# Patient Record
Sex: Female | Born: 1955 | Race: White | Marital: Single | Smoking: Never smoker
Health system: Southern US, Community
[De-identification: ages and names within clinical notes are randomized; demographics above are authoritative.]

## PROBLEM LIST (undated history)

## (undated) DIAGNOSIS — F1290 Cannabis use, unspecified, uncomplicated: Secondary | ICD-10-CM

## (undated) DIAGNOSIS — D649 Anemia, unspecified: Secondary | ICD-10-CM

## (undated) DIAGNOSIS — A549 Gonococcal infection, unspecified: Secondary | ICD-10-CM

## (undated) DIAGNOSIS — K08409 Partial loss of teeth, unspecified cause, unspecified class: Secondary | ICD-10-CM

## (undated) DIAGNOSIS — Z9289 Personal history of other medical treatment: Secondary | ICD-10-CM

## (undated) DIAGNOSIS — R87619 Unspecified abnormal cytological findings in specimens from cervix uteri: Secondary | ICD-10-CM

## (undated) DIAGNOSIS — J45909 Unspecified asthma, uncomplicated: Secondary | ICD-10-CM

## (undated) DIAGNOSIS — Z87898 Personal history of other specified conditions: Secondary | ICD-10-CM

## (undated) DIAGNOSIS — D219 Benign neoplasm of connective and other soft tissue, unspecified: Secondary | ICD-10-CM

## (undated) DIAGNOSIS — Z8679 Personal history of other diseases of the circulatory system: Secondary | ICD-10-CM

## (undated) DIAGNOSIS — R51 Headache: Secondary | ICD-10-CM

## (undated) DIAGNOSIS — N750 Cyst of Bartholin's gland: Secondary | ICD-10-CM

## (undated) DIAGNOSIS — Z8619 Personal history of other infectious and parasitic diseases: Secondary | ICD-10-CM

## (undated) DIAGNOSIS — G43909 Migraine, unspecified, not intractable, without status migrainosus: Secondary | ICD-10-CM

## (undated) DIAGNOSIS — O99345 Other mental disorders complicating the puerperium: Secondary | ICD-10-CM

## (undated) DIAGNOSIS — IMO0002 Reserved for concepts with insufficient information to code with codable children: Secondary | ICD-10-CM

## (undated) DIAGNOSIS — Z8669 Personal history of other diseases of the nervous system and sense organs: Secondary | ICD-10-CM

## (undated) DIAGNOSIS — B1920 Unspecified viral hepatitis C without hepatic coma: Secondary | ICD-10-CM

## (undated) DIAGNOSIS — B009 Herpesviral infection, unspecified: Secondary | ICD-10-CM

## (undated) DIAGNOSIS — F53 Postpartum depression: Secondary | ICD-10-CM

## (undated) HISTORY — DX: Unspecified viral hepatitis C without hepatic coma: B19.20

## (undated) HISTORY — DX: Cyst of Bartholin's gland: N75.0

## (undated) HISTORY — PX: CLAVICLE EXCISION: SUR503

## (undated) HISTORY — DX: Benign neoplasm of connective and other soft tissue, unspecified: D21.9

## (undated) HISTORY — DX: Postpartum depression: F53.0

## (undated) HISTORY — DX: Unspecified asthma, uncomplicated: J45.909

## (undated) HISTORY — DX: Personal history of other diseases of the circulatory system: Z86.79

## (undated) HISTORY — DX: Migraine, unspecified, not intractable, without status migrainosus: G43.909

## (undated) HISTORY — DX: Personal history of other specified conditions: Z87.898

## (undated) HISTORY — DX: Headache: R51

## (undated) HISTORY — DX: Other mental disorders complicating the puerperium: O99.345

## (undated) HISTORY — DX: Personal history of other infectious and parasitic diseases: Z86.19

## (undated) HISTORY — DX: Herpesviral infection, unspecified: B00.9

## (undated) HISTORY — DX: Cannabis use, unspecified, uncomplicated: F12.90

## (undated) HISTORY — DX: Personal history of other medical treatment: Z92.89

## (undated) HISTORY — DX: Unspecified abnormal cytological findings in specimens from cervix uteri: R87.619

## (undated) HISTORY — DX: Partial loss of teeth, unspecified cause, unspecified class: K08.409

## (undated) HISTORY — DX: Reserved for concepts with insufficient information to code with codable children: IMO0002

## (undated) HISTORY — PX: WISDOM TOOTH EXTRACTION: SHX21

## (undated) HISTORY — DX: Anemia, unspecified: D64.9

## (undated) HISTORY — DX: Gonococcal infection, unspecified: A54.9

## (undated) HISTORY — DX: Personal history of other diseases of the nervous system and sense organs: Z86.69

---

## 1974-07-05 HISTORY — PX: CYSTECTOMY: SUR359

## 2008-07-05 HISTORY — PX: DILATION AND CURETTAGE OF UTERUS: SHX78

## 2011-12-30 ENCOUNTER — Other Ambulatory Visit: Payer: Self-pay | Admitting: Internal Medicine

## 2011-12-30 ENCOUNTER — Ambulatory Visit
Admission: RE | Admit: 2011-12-30 | Discharge: 2011-12-30 | Disposition: A | Discharge status: Home / Self Care | Payer: Self-pay | Source: Ambulatory Visit | Attending: Internal Medicine | Admitting: Internal Medicine

## 2011-12-30 DIAGNOSIS — R1031 Right lower quadrant pain: Secondary | ICD-10-CM

## 2011-12-30 MED ORDER — IOHEXOL 300 MG/ML  SOLN
100.0000 mL | Freq: Once | INTRAMUSCULAR | Status: AC | PRN
  Administered 2011-12-30: 100 mL via INTRAVENOUS

## 2012-02-01 ENCOUNTER — Encounter: Payer: Self-pay | Admitting: Obstetrics and Gynecology

## 2012-02-16 ENCOUNTER — Ambulatory Visit (INDEPENDENT_AMBULATORY_CARE_PROVIDER_SITE_OTHER): Payer: BC Managed Care – PPO | Admitting: Obstetrics and Gynecology

## 2012-02-16 ENCOUNTER — Encounter: Payer: Self-pay | Admitting: Obstetrics and Gynecology

## 2012-02-16 VITALS — BP 100/62 | HR 68 | Ht 67.0 in | Wt 133.0 lb

## 2012-02-16 DIAGNOSIS — N951 Menopausal and female climacteric states: Secondary | ICD-10-CM

## 2012-02-16 DIAGNOSIS — G4700 Insomnia, unspecified: Secondary | ICD-10-CM

## 2012-02-16 NOTE — Patient Instructions (Addendum)
Begin Progesterone cream 100 mg once daily every other day when Bi-est is applied

## 2012-02-16 NOTE — Progress Notes (Signed)
Menopausal symptoms:anxiety, dry skin, hot flashes, insomnia, moodiness, no energy, vaginal dryness  The patient is taking hormone replacement therapy The patient  is taking a Calcium supplement. The patient participates in regular exercise: yes. Post-menopausal bleeding:no  The patient is sexually active.  Last Pap: was normal 2 YEARS AGO Last mammogram: was normal April  2013 Last DEXA scan : 3 YEARS AGO NL  History of DVT/PE: NO Family history of breast cancer: No Family history of endometrial cancer:No

## 2012-02-16 NOTE — Progress Notes (Signed)
56 YO on BHRT: Bi-est (80:20) 2.5 Cream- 1 pump every other day, Progesterone 100 mg Cream bid, DHEA 10 mg every other day and Testosterone 0.5 mg Cream, and  Estrovera daily.  She uses [facility] in [facility], [place], [place]  ([place], [place]. Has a history of Hepatitis C (since age 70) that has been stable and takes herbal supplements to support her liver. She presents today because of hot flashes, moodiness and insomnia.  Patient was previously on the same Biest dose, but daily and took oral Progesterone 200 mg daily.  Her (saliva) hormone results showed that her Pg/E2 ratio was low as was her DHEAS.  Her estradiol was 2.8 ,  testosterone 49 and morning Cortisol 7.6. States that her vitamin D done by PCP was normal and thyroid panel also (copy of labs are forthcoming)  A: Menopausal Symptoms     Bio-Identical Hormone Therapy     Hepatitis C  P:  Spent 60 minutes face-to-face time with patient discussing her menstrual history, hormone therapy history and past/present symptoms        Advised patient that she did not need to take both  Estrovera  and  hormone therapy as they are for the same symptoms-gave      brochure on Estrovera       Explained the difference in progesterone dosing orally vs transdermal route in that the latter allows for significantly lower doses      Patient to begin taking Progesterone 100 mg Cream only once every other day with her Biest until that prescription has been used      then an order will be placed for Progesterone Cream 20 mg      Once patient has depleted current Biest dose, that too will be changed to Biest (90:10) 2.5 mg      Given that patient gets excess androgen symptoms (acne, oily skin) with daily DHEAS and the fact that she is using Testosterone 0.5 mg/d     I recommended that she stop the DHEA and reduce the Testosterone dose.  For now she wants to continue Testosterone dose but will    stop the DHEA.  Reviewed risks of  excess testosterone,  to include, but not limited to: increased lipids, unwanted hair growth, female pattern baldness,     deepening of voice, clitoromegaly, acne, oily skin and aggression.      To do hormonal test once patient's been on new regimen at least for 8 weeks with repeat saliva test (estradiol and progesterone only)      Reviewed sleep hygiene       RTO-TBA  Branden Shallenberger, PA-C

## 2012-02-17 ENCOUNTER — Encounter: Payer: Self-pay | Admitting: Obstetrics and Gynecology

## 2012-02-18 ENCOUNTER — Telehealth: Payer: Self-pay | Admitting: Obstetrics and Gynecology

## 2012-02-18 NOTE — Telephone Encounter (Signed)
TRIAGE/APPT.AND RX QUES.

## 2012-02-21 ENCOUNTER — Telehealth: Payer: Self-pay

## 2012-02-21 NOTE — Telephone Encounter (Signed)
Lm on pt vm to call back

## 2012-02-25 ENCOUNTER — Telehealth: Payer: Self-pay

## 2012-03-01 ENCOUNTER — Encounter: Payer: Self-pay | Admitting: Obstetrics and Gynecology

## 2012-04-18 ENCOUNTER — Telehealth: Payer: Self-pay | Admitting: Obstetrics and Gynecology

## 2012-04-18 NOTE — Telephone Encounter (Signed)
FAx received from pharmacy regarding possible change in medication. TC to pt.  States at last visit with EP, it was discussed possible change in dosage of medication when current Rx was completed.  Stats has been using the Progesterone and Bi-est the same night, every other night.  On the nights she doesn't use the cream, is having night sweats.  Questioning if needs dosage changed and how often to use.  Also questioning when to have next bio-identical testing done.  EP to be made aware.

## 2012-04-18 NOTE — Telephone Encounter (Signed)
Fax received re change in med.  States at last visit EP was going to change

## 2012-04-21 ENCOUNTER — Telehealth: Payer: Self-pay | Admitting: Obstetrics and Gynecology

## 2012-04-21 NOTE — Telephone Encounter (Signed)
Returned pt's call. Informed of Rx . To retest in 8 weeks. Pt verbalizes comprehension.

## 2012-04-21 NOTE — Telephone Encounter (Signed)
TC to pt. LM RX has been called to pharmacy. To use daily.  To call for further instructions.  Per EP after pt has used new dosage for 8 weeks to be retested. To let us know if needs kit. Rx called to Sharee Pimple at [facility] [phone]:    Progeseterone 30 mg  Cream daily, use as directed, 1 month supply with 12 rf    Biest  (80:20) 2.5 mg daily, use as directed , 1 month supply with 12 rf

## 2012-07-04 ENCOUNTER — Other Ambulatory Visit: Payer: Self-pay | Admitting: Obstetrics and Gynecology

## 2012-07-04 NOTE — Telephone Encounter (Signed)
Tc from pt regarding message. Pt states that she and the pharmacist has been trying to contact the  Office for refills on pt hormone meds. Called in rx Testosterone topical gel 1MG /GM,

## 2012-07-04 NOTE — Telephone Encounter (Signed)
Continuing from last note. Called in 3 rx for pt to her pharmacy. Testosterone gel 1MG /GM, Progesterone 30 mg cream, Biest (80:20) 2.5 mg total to use as directed. Informed pt that we need to retest and pt states that she will pick up Saliva Test when she pick up med and as soon as she get results back; she wil make an appt to see ep.

## 2012-09-04 ENCOUNTER — Encounter: Payer: BC Managed Care – PPO | Admitting: Obstetrics and Gynecology

## 2012-09-13 ENCOUNTER — Ambulatory Visit: Payer: BC Managed Care – PPO | Admitting: Obstetrics and Gynecology

## 2012-09-13 ENCOUNTER — Encounter: Payer: Self-pay | Admitting: Obstetrics and Gynecology

## 2012-09-13 VITALS — BP 102/60 | Wt 138.0 lb

## 2012-09-13 DIAGNOSIS — Z7189 Other specified counseling: Secondary | ICD-10-CM

## 2012-09-13 DIAGNOSIS — N951 Menopausal and female climacteric states: Secondary | ICD-10-CM

## 2012-09-13 NOTE — Progress Notes (Signed)
56 YO  on Biest (80:20) 2.5 mg daily with Progesterone ? dosage (60 mg/gm & takes 1/2 mL), and  Testostrone 0.5 mg.  Patient stopped DHEA in August because of it causing acne.  She complains of hot flashes and states that if she didn't take Estrovera with her hormones they would be unbearable.  She goes on to say that it is an expensive product (i.e. $32/month).  Additionally she reports increased "fat" around her waist, moodiness and insomnia.  She's here today to review her hormone results.  Estradiol = 3.0 9 (range 1.3-3.3),  Progesterone = 240 (H) (range 12-100) , Pg/E2 = 80 (L) (range 100-500), Testosterone = 7 (L)  (range 16-55); DHEAS = < 1.0  (range 2-23); and cortisol = 7.4  (range 3.7-9.5).  Patient's labs indicate adrenal strain, estrogen dominance and depressed androgen function..  Patient's progesterone dosage is uncertain so will verify with pharmacy.  The patient would like to decrease her estrogen amount.  Patient reports amenorrhea at age 89 x 4 years then again age 64 for good.  Additionally has had two occasions when her right lower quadrant appears to swell , she gets constipated and attributes this to what she describes as times when her estrogen surges.  A: Menopausal Symptoms     Occasional Pelvic Discomfort  P: Verify medication doses with [facility]  [phone] [patient's progesterone is 30 mg/day      Plans are to alter estrogen dose (decrease),  consider 5 mg of DHEA instead of  10 mg  in an effort to     increase the testosterone & stabilize cortisol without causing the patient any side effects (i.e. acne)     Urged periodic renewal time to support adrenals (in addition to the supplements she has chosen to take)      To prescribe Biest (50:50)  1 mg/1/2 ml  #30 mL  apply daily as directed 11 refills     Progesterone 4%/1/2 ml   #30 mL  apply qhs daily 11 refills     Tesosterone 0.75 mg  #30 mL apply q a.m. daily between thighs 4 refills      DHEA 5mg   #30 1 po qd  11  refills        Follow up in 6 weeks        Will do ultrasound for pelvic discomfort prior to annual       RTO-AEx  or prn      Once new therapy has begun, will follow up in 3 months with symptom check or repeat saliva test      Jamare Vanatta, PA-C

## 2012-09-14 MED ORDER — AMBULATORY NON FORMULARY MEDICATION: Status: DC

## 2012-09-14 MED ORDER — AMBULATORY NON FORMULARY MEDICATION: 5.0000 mg | Freq: Every day | Status: AC

## 2012-09-14 MED ORDER — AMBULATORY NON FORMULARY MEDICATION: 40.0000 mg | Freq: Every day | Status: DC

## 2012-09-14 MED ORDER — AMBULATORY NON FORMULARY MEDICATION: 0.7500 mg | Freq: Every morning | Status: AC

## 2012-12-17 IMAGING — CT CT ABD-PELV W/ CM
1 of 5 series · 14 of 36 positions shown, 19 images · IV contrast (READICAT/WATER & [ID] [id number])
Comparison: None.

CLINICAL DATA: Right lower quadrant pain.

CT ABDOMEN AND PELVIS WITH CONTRAST
TECHNIQUE: Multidetector CT imaging of the abdomen and pelvis was
performed following the standard protocol during bolus
administration of intravenous contrast.
Contrast: 100mL OMNIPAQUE IOHEXOL 300 MG/ML  SOLN

[Series 2: abd/pelvis with · axial · 0.70mm/px · z∈[-358,+2]mm · 14 of 82 slices shown, 19 images]
[im 5/82  soft-tissue]
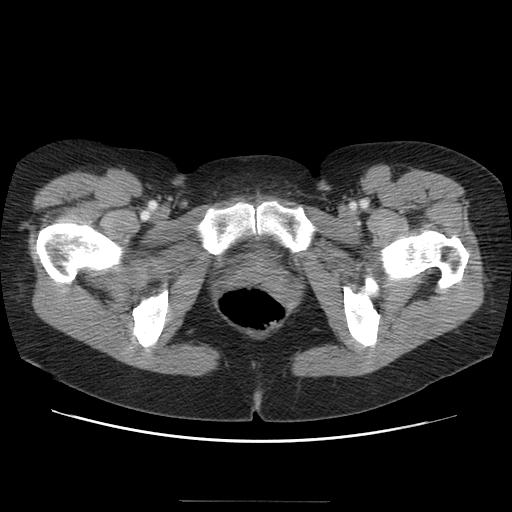
[im 5/82  bone]
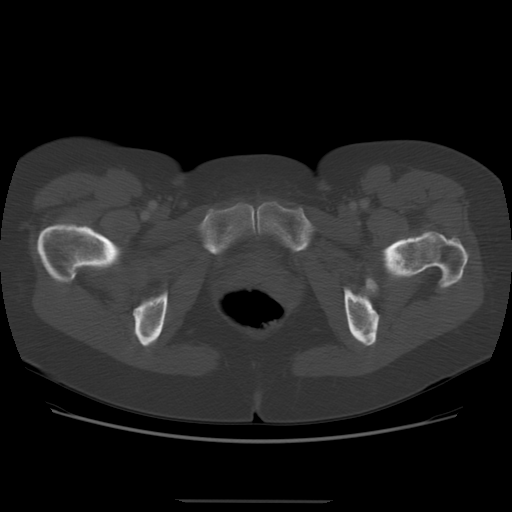
[im 10/82  soft-tissue]
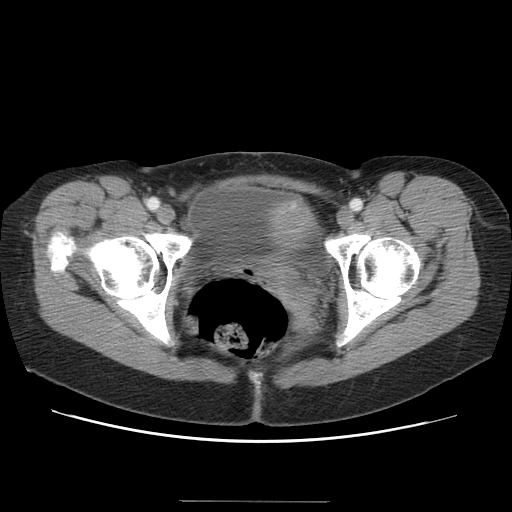
[im 19/82  soft-tissue]
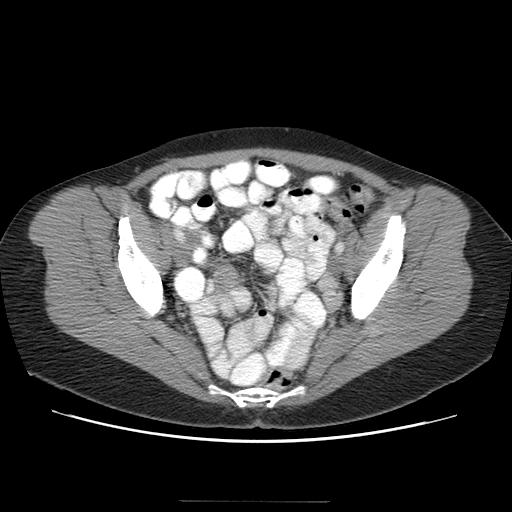
[im 23/82  soft-tissue]
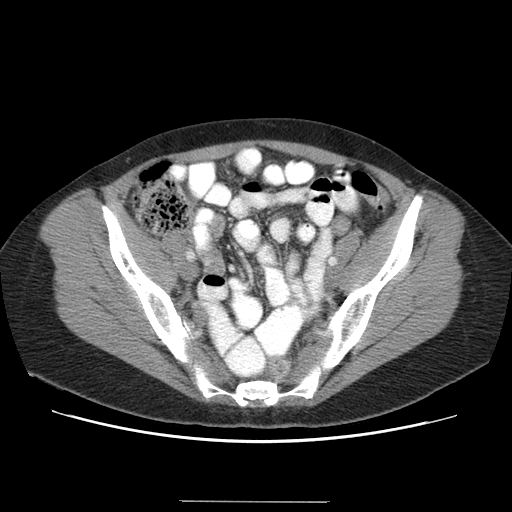
[im 28/82  soft-tissue]
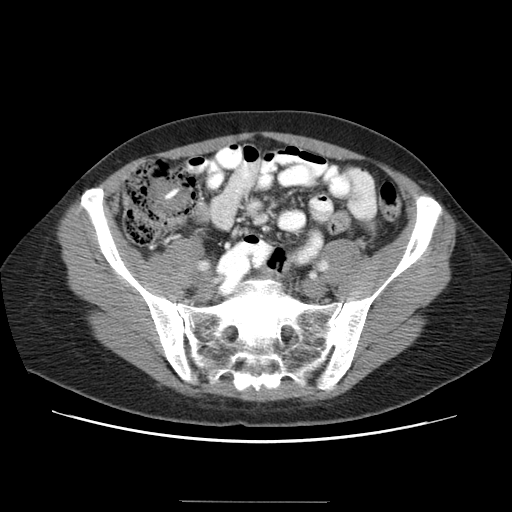
[im 37/82  soft-tissue]
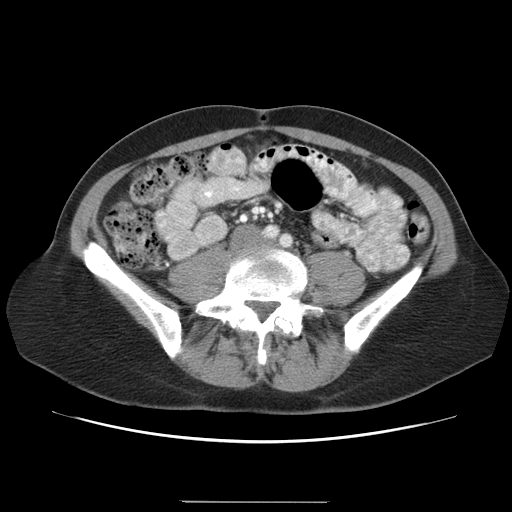
[im 41/82  soft-tissue]
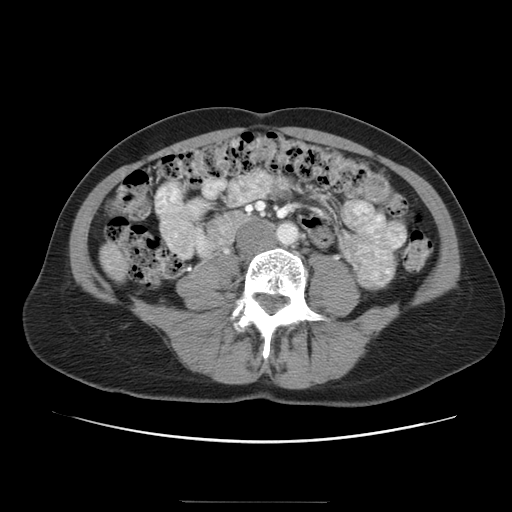
[im 46/82  soft-tissue]
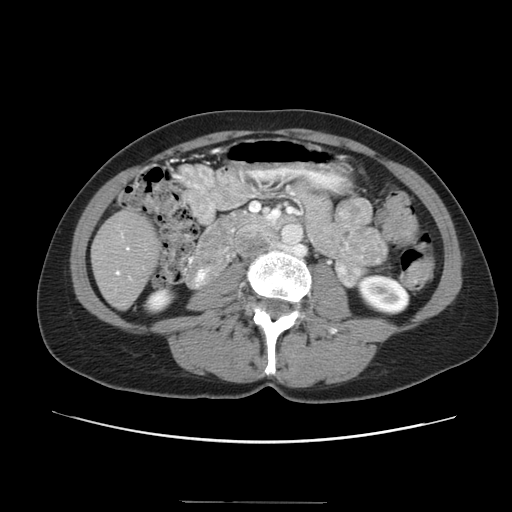
[im 55/82  soft-tissue]
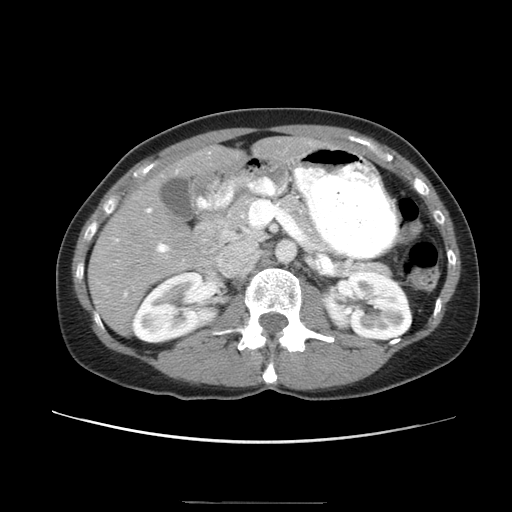
[im 55/82  bone]
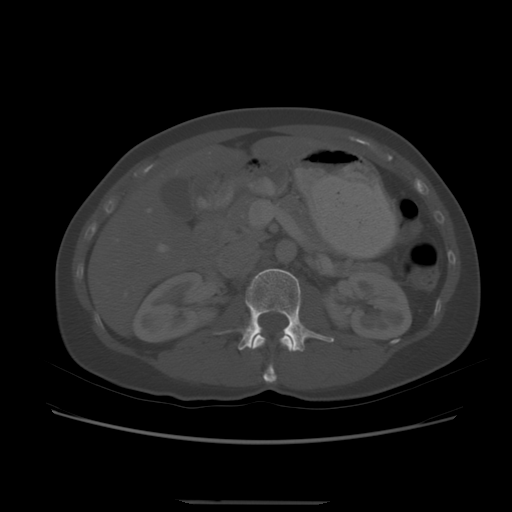
[im 59/82  soft-tissue]
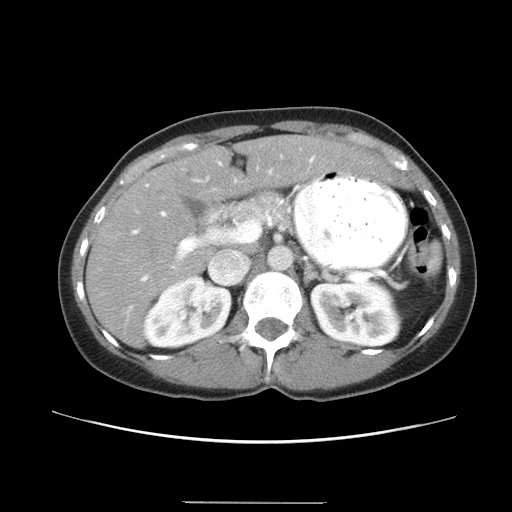
[im 64/82  soft-tissue]
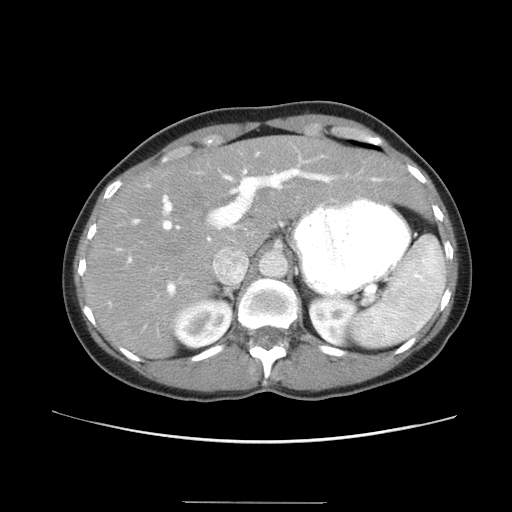
[im 64/82  lung]
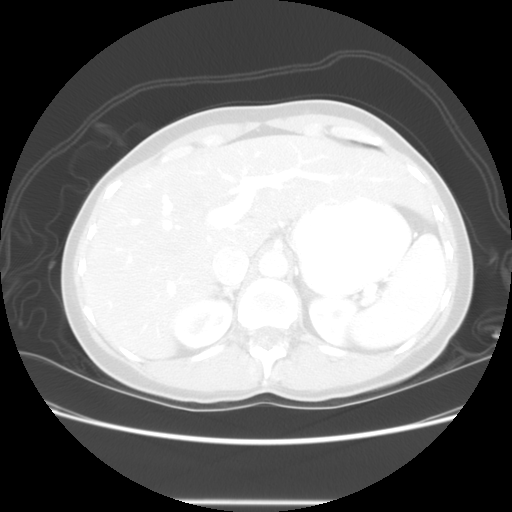
[im 68/82  lung]
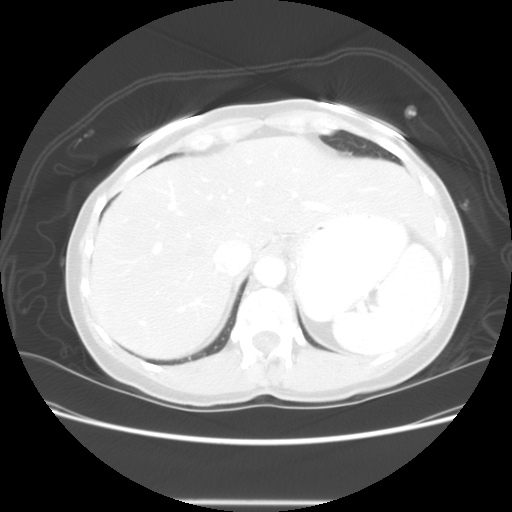
[im 73/82  soft-tissue]
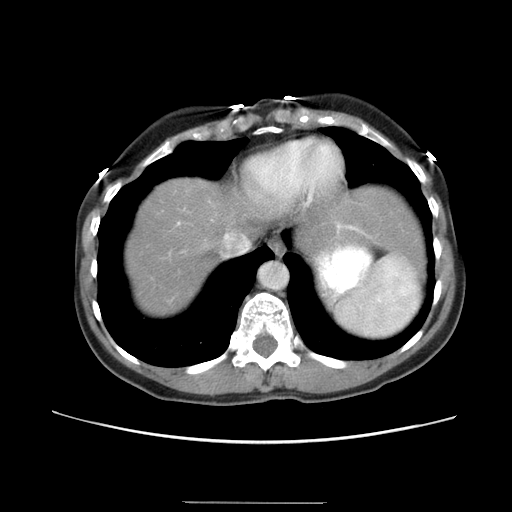
[im 73/82  lung]
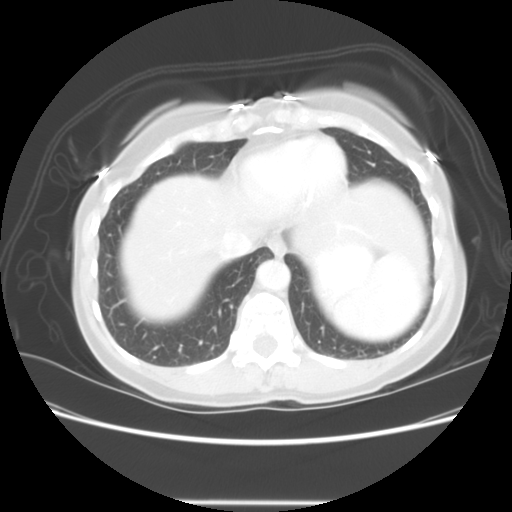
[im 77/82  soft-tissue]
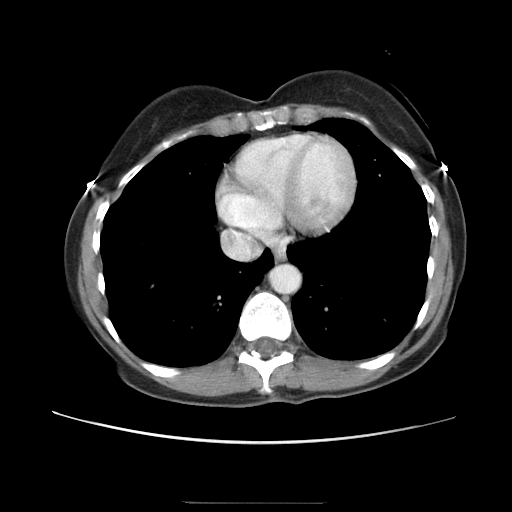
[im 77/82  lung]
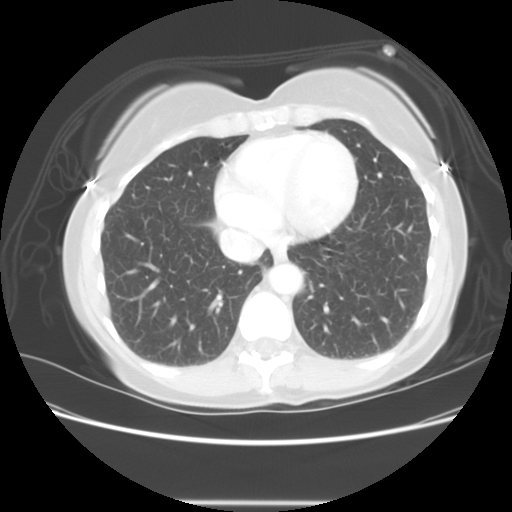

[14 of 36 positions shown; findings below may reference images not displayed]

FINDINGS: Lung bases are clear.  No pericardial fluid.

Low attenuation liver suggests fatty infiltration.  No focal
hepatic lesion present.  Gallbladder, pancreas, spleen, adrenal
glands, and kidneys are normal.  The stomach, small bowel, and
cecum are normal.  There is a linear oblong density at the
ileocecal valve which may represent an oral medication.  The
appendix is not identified; however, no pericecal inflammation is
present.  Moderatevolume stool throughout the colon.  No evidence
diverticulitis.

Abdominal aorta normal caliber.  There is a left retroaortic renal
vein.

No free fluid the pelvis.  There is a lobular contour to the uterus
with enhancing rounded lesions suggesting leiomyomas.  The bladder
appears normal.  There is no adnexal abnormality.  No pelvic
lymphadenopathy. Review of  bone windows demonstrates no aggressive
osseous lesions.  There are there is mild facet hypertrophy in the
lower lumbar spine.
IMPRESSION: 1..  No clear explanation for right lower quadrant pain.
2.  Appendix is not identified but there is no pericecal
inflammation or secondary signs appendicitis.
3.  Fibroid uterus.
4.  Potential mild hepatic steatosis.
5..  Mild facet arthropathy lower lumbar spine.

## 2014-05-06 ENCOUNTER — Encounter: Payer: Self-pay | Admitting: Obstetrics and Gynecology

## 2016-06-02 DIAGNOSIS — K7460 Unspecified cirrhosis of liver: Secondary | ICD-10-CM | POA: Insufficient documentation

## 2019-02-07 ENCOUNTER — Other Ambulatory Visit: Payer: Self-pay | Admitting: Gastroenterology

## 2019-02-07 DIAGNOSIS — Z8619 Personal history of other infectious and parasitic diseases: Secondary | ICD-10-CM

## 2019-02-16 ENCOUNTER — Other Ambulatory Visit: Payer: Self-pay

## 2019-02-22 ENCOUNTER — Other Ambulatory Visit: Payer: Self-pay | Admitting: Gastroenterology

## 2019-02-22 ENCOUNTER — Other Ambulatory Visit: Payer: Self-pay

## 2019-10-28 DIAGNOSIS — R921 Mammographic calcification found on diagnostic imaging of breast: Secondary | ICD-10-CM | POA: Insufficient documentation

## 2020-04-03 ENCOUNTER — Other Ambulatory Visit: Payer: Self-pay | Admitting: Gastroenterology

## 2020-04-03 DIAGNOSIS — Z8619 Personal history of other infectious and parasitic diseases: Secondary | ICD-10-CM

## 2020-10-15 ENCOUNTER — Other Ambulatory Visit: Payer: Self-pay | Admitting: Gastroenterology

## 2020-10-15 DIAGNOSIS — Z8719 Personal history of other diseases of the digestive system: Secondary | ICD-10-CM

## 2020-10-15 DIAGNOSIS — Z8619 Personal history of other infectious and parasitic diseases: Secondary | ICD-10-CM

## 2020-11-07 ENCOUNTER — Other Ambulatory Visit: Payer: Self-pay

## 2020-11-07 ENCOUNTER — Ambulatory Visit
Admission: RE | Admit: 2020-11-07 | Discharge: 2020-11-07 | Disposition: A | Discharge status: Home / Self Care | Payer: Medicare Other | Source: Ambulatory Visit | Attending: Gastroenterology | Admitting: Gastroenterology

## 2020-11-07 DIAGNOSIS — Z8719 Personal history of other diseases of the digestive system: Secondary | ICD-10-CM

## 2020-11-07 DIAGNOSIS — Z8619 Personal history of other infectious and parasitic diseases: Secondary | ICD-10-CM

## 2021-04-22 DIAGNOSIS — M79641 Pain in right hand: Secondary | ICD-10-CM | POA: Insufficient documentation

## 2021-04-22 DIAGNOSIS — M65331 Trigger finger, right middle finger: Secondary | ICD-10-CM | POA: Insufficient documentation

## 2021-10-09 ENCOUNTER — Other Ambulatory Visit: Payer: Self-pay | Admitting: Obstetrics and Gynecology

## 2021-10-09 DIAGNOSIS — Z8262 Family history of osteoporosis: Secondary | ICD-10-CM

## 2021-10-09 DIAGNOSIS — R29890 Loss of height: Secondary | ICD-10-CM

## 2021-10-26 IMAGING — US US ABDOMEN LIMITED RUQ/ASCITES
1 series · 14 of 25 positions shown · non-contrast
Comparison: CT abdomen pelvis dated 12/30/2011.

CLINICAL DATA: 65-year-old female with history of hepatitis C and
cirrhosis.

EXAM:
ULTRASOUND ABDOMEN LIMITED RIGHT UPPER QUADRANT

[Series 1: us abdomen limited ruq/ascites · 0.22mm/px · 14 of 38 slices shown]
[im 1/38]
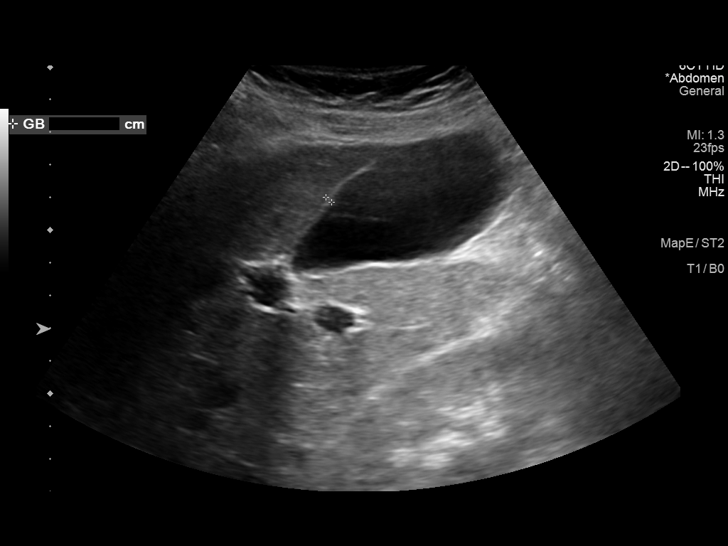
[im 4/38]
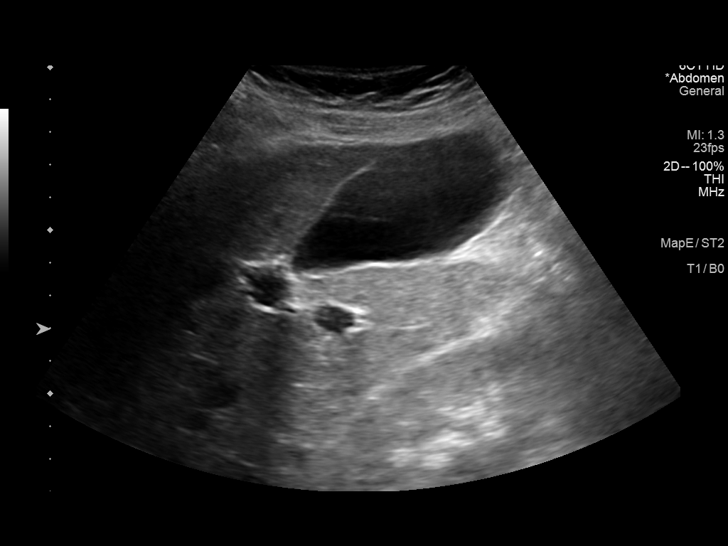
[im 7/38]
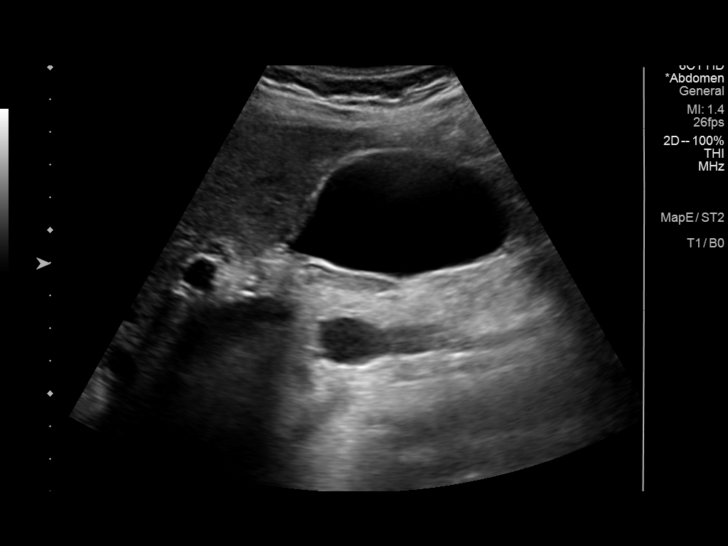
[im 10/38]
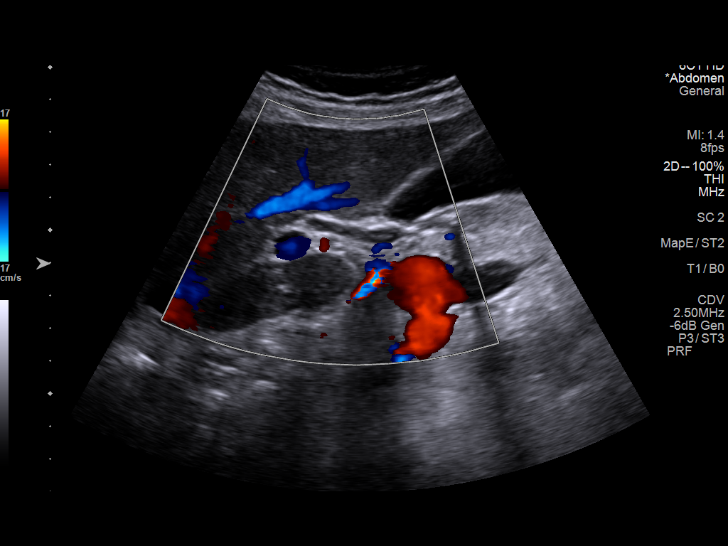
[im 13/38]
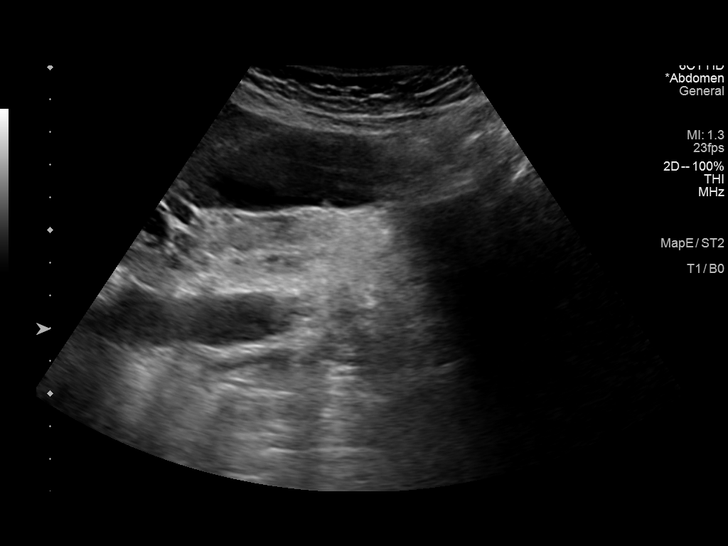
[im 14/38]
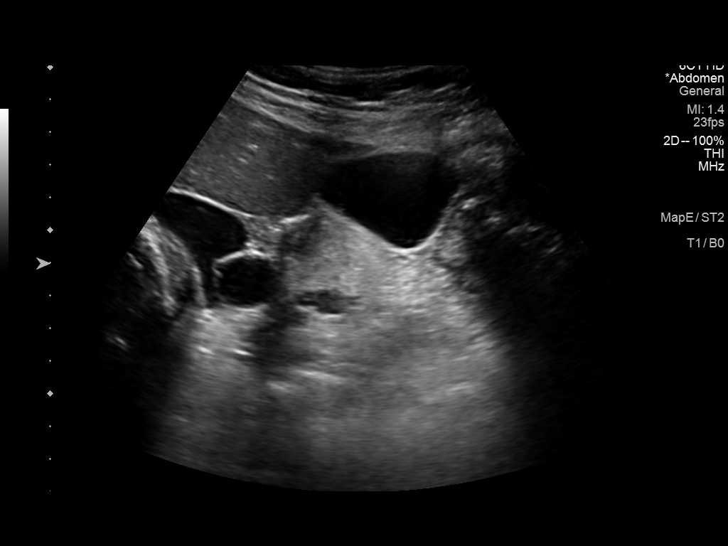
[im 17/38]
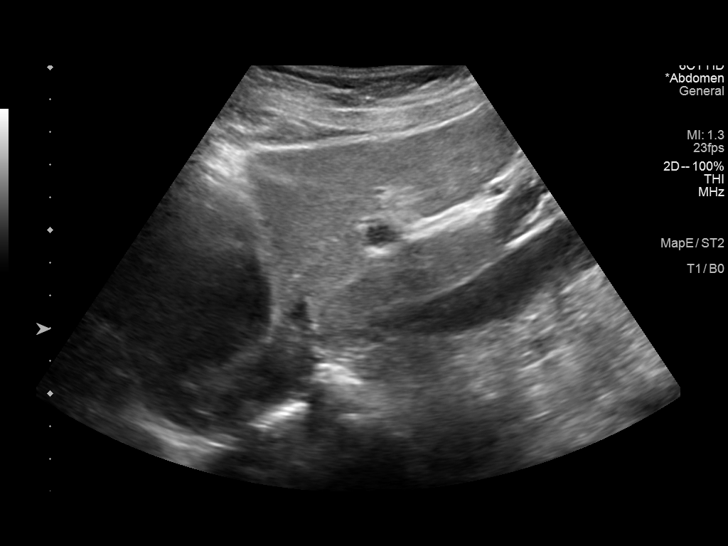
[im 21/38]
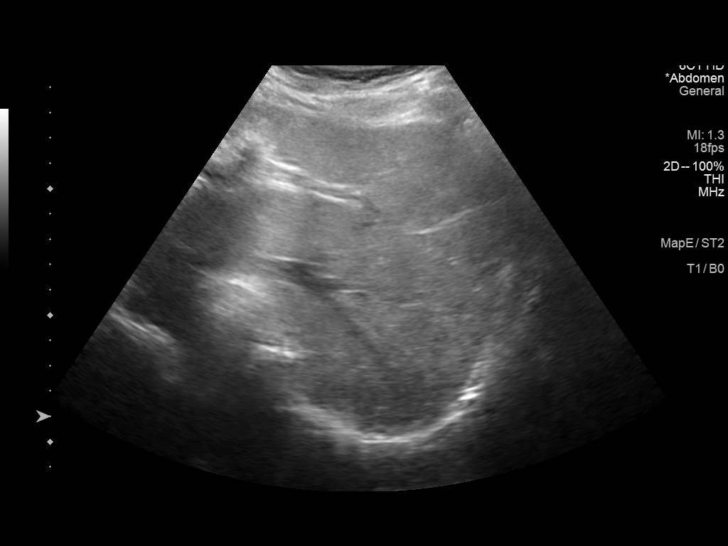
[im 24/38]
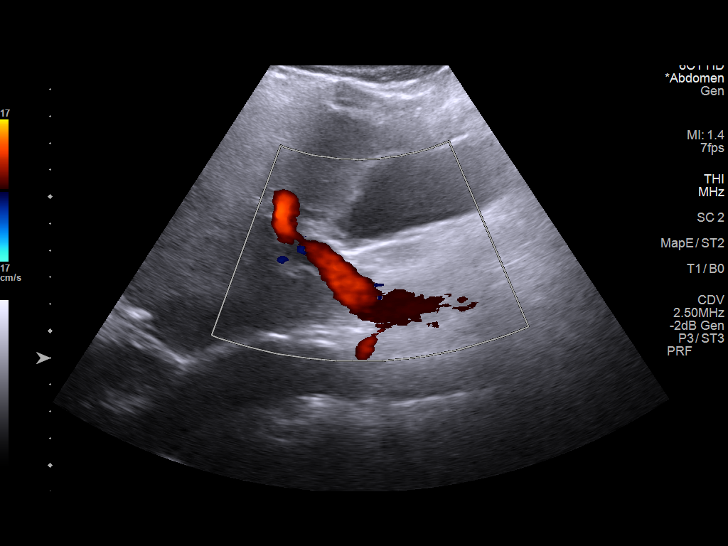
[im 25/38]
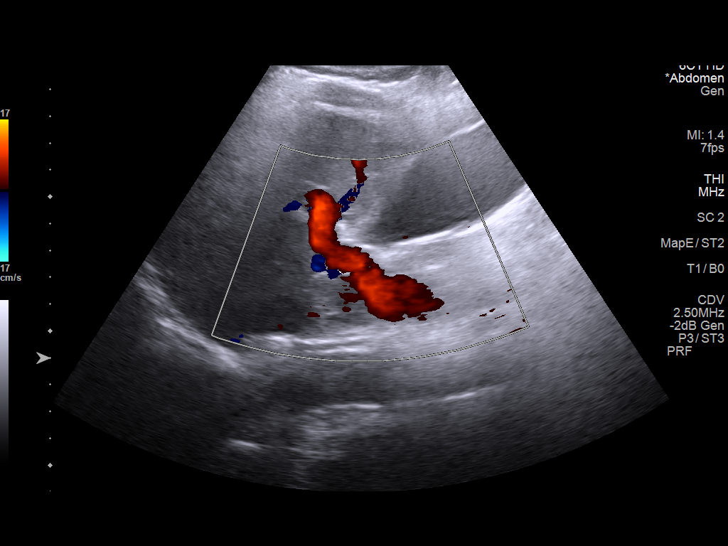
[im 28/38]
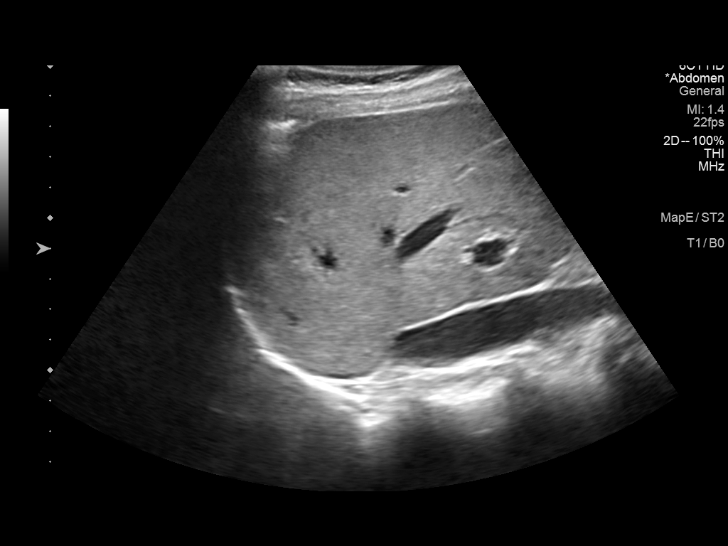
[im 31/38]
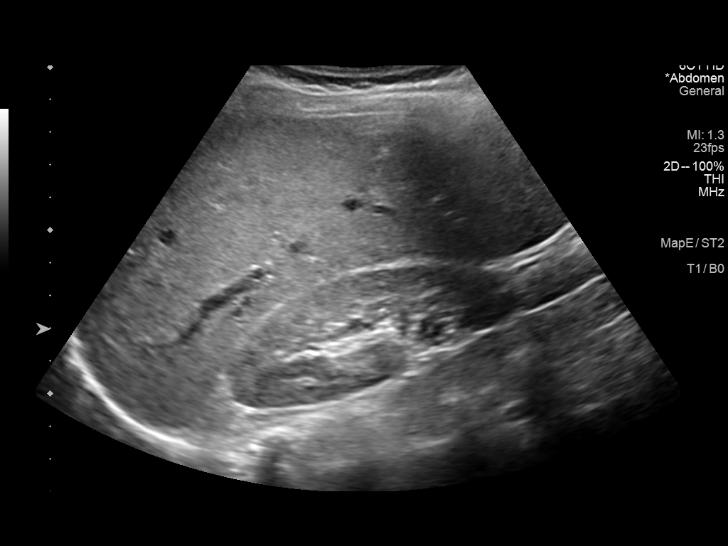
[im 34/38]
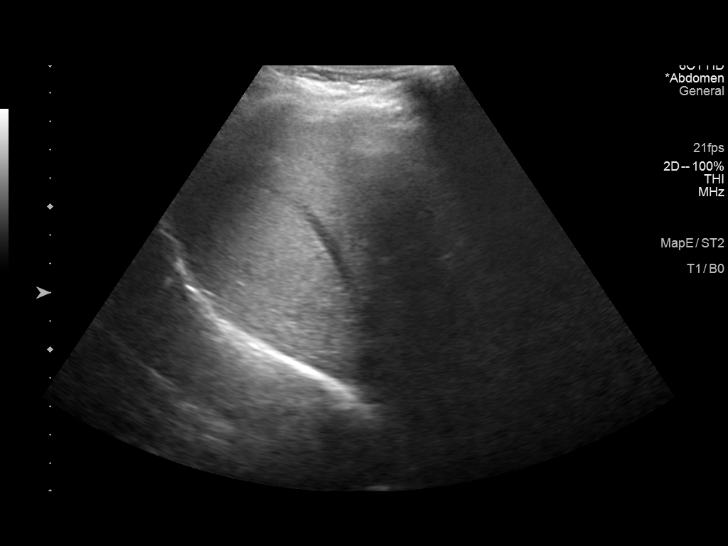
[im 38/38]
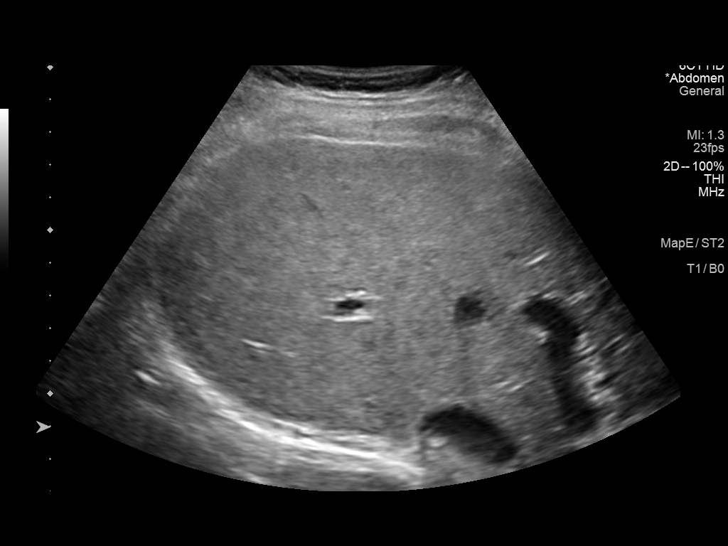

[14 of 25 positions shown; findings below may reference images not displayed]

FINDINGS: Gallbladder:

No gallstone, gallbladder wall thickening, or pericholecystic fluid.
Negative sonographic Murphy's sign. There is a 4 mm gallbladder
polyp.

Common bile duct:

Diameter: 3 mm

Liver:

There is diffuse increased liver echogenicity most commonly seen in
the setting of fatty infiltration. Superimposed inflammation or
fibrosis is not excluded. Clinical correlation is recommended.
Portal vein is patent on color Doppler imaging with normal direction
of blood flow towards the liver.

Other: None.
IMPRESSION: 1. Fatty liver.
2. A 4 mm gallbladder polyp.  No follow-up needed.

## 2022-04-21 ENCOUNTER — Ambulatory Visit: Payer: No Typology Code available for payment source | Admitting: Physician Assistant

## 2022-06-01 ENCOUNTER — Ambulatory Visit: Payer: Medicare Other | Attending: Cardiology | Admitting: Cardiology

## 2022-06-01 ENCOUNTER — Ambulatory Visit (INDEPENDENT_AMBULATORY_CARE_PROVIDER_SITE_OTHER): Payer: Medicare Other

## 2022-06-01 VITALS — BP 138/78 | HR 67 | Ht 67.0 in | Wt 138.8 lb

## 2022-06-01 DIAGNOSIS — M79605 Pain in left leg: Secondary | ICD-10-CM | POA: Insufficient documentation

## 2022-06-01 DIAGNOSIS — R002 Palpitations: Secondary | ICD-10-CM | POA: Insufficient documentation

## 2022-06-01 DIAGNOSIS — R0789 Other chest pain: Secondary | ICD-10-CM | POA: Insufficient documentation

## 2022-06-01 DIAGNOSIS — M7989 Other specified soft tissue disorders: Secondary | ICD-10-CM | POA: Diagnosis not present

## 2022-06-01 DIAGNOSIS — I872 Venous insufficiency (chronic) (peripheral): Secondary | ICD-10-CM | POA: Insufficient documentation

## 2022-06-01 DIAGNOSIS — M79604 Pain in right leg: Secondary | ICD-10-CM | POA: Diagnosis not present

## 2022-06-01 NOTE — Progress Notes (Signed)
Cardiology Office Note:    Date:  06/01/2022   ID:  Misty Owens, DOB 22-Mar-1956, MRN [id number]  PCP:  Pcp, No  Cardiologist:  Berniece Salines, DO  Electrophysiologist:  None   Referring MD: Wandra Scot Pru*   " I am palpitation"   History of Present Illness:    Misty Owens is a 66 y.o. female with a hx of hepatitis C infection, asthma, here today to be evaluated for multiple reasons.  She tells me that she has been experiencing intermittent palpitations.  She notes that this is an abrupt onset of fast heartbeat.  Comes on and and off.  Nothing makes this better or worse.  Associated with this when she gets the significant palpitations she tells me it feels that she is getting chest discomfort.  Usually only associated with when she get these palpitations.  At times she has had some dizziness.  She is also really concerned because she is having some leg pain on exertion.  She also notes that she realized recently that she is getting blue veins around her legs.  Past Medical History:  Diagnosis Date   Abnormal Pap smear    1967, cryotherapy   Anemia    Asthma    Bartholin cyst    Bartholin's Abscess   Fibroid    Gonorrhea    H/O varicella    Headache(784.0)    Hepatitis C    Hepatitis C infection    Herpes simplex without mention of complication    History of blood transfusion    History of measles, mumps, or rubella    History of varicose veins    Hx of migraine headaches    Marijuana use    Migraine    Postpartum depression    Wisdom teeth extracted     Past Surgical History:  Procedure Laterality Date   CLAVICLE EXCISION     CYSTECTOMY  1976   2 OVARIAN CYST 3 YEARS APART   DILATION AND CURETTAGE OF UTERUS  2010   menorrhagia & endometrial polyp   WISDOM TOOTH EXTRACTION      Current Medications: Current Meds  Medication Sig   AMBULATORY NON FORMULARY MEDICATION Medication Name: Biest (50:50)  1 mg/1/2 mL apply daily as directed   AMBULATORY NON  FORMULARY MEDICATION Place 40 mg onto the skin at bedtime. Medication Name: Progesterone 4%/1/2 mL  apply as directed daily in the evening;  alternate site of application every day   AMBULATORY NON FORMULARY MEDICATION Place 0.75 mg onto the skin every morning. Medication Name: Testosterone 0.75 mg/1/2 mL apply between thighs daily   AMBULATORY NON FORMULARY MEDICATION Take 5 mg by mouth daily. Medication Name: DHEA  5 mg 1 po daily   Ashwagandha Extract 2.5 % POWD by Does not apply route.   b complex vitamins tablet Take 1 tablet by mouth daily.   Cyanocobalamin (VITAMIN B 12 PO) Take by mouth.   DHEA 25 MG CAPS    estradiol (ESTRACE) 1 MG tablet 1 tablet Orally Once a day for 30 day(s)   fish oil-omega-3 fatty acids 1000 MG capsule Take 2 g by mouth daily.   magnesium 30 MG tablet Take 30 mg by mouth 2 (two) times daily.   MILK THISTLE PO Take by mouth.   NON FORMULARY PT TAKE PROGESTERONE CREAM 100 MG BID,TESTOSTERONE .'5MG'$ , DHEA 10 4 X A WEEK, AND BI-ESTROGEN 2.'2MG'$ 1X EVERY OTHER DAY.   Polyethyl Glyc-Propyl Glyc PF (SYSTANE PRESERVATIVE FREE) 0.4-0.3 % SOLN  UNABLE TO FIND PT TAKE FOOD ENZYMES   UNABLE TO FIND Pt takes rhodioca rosea, l b s 2 , triphala,stakeology,valerian for sleep, lysine,   valACYclovir (VALTREX) 500 MG tablet Take 500 mg by mouth 2 (two) times daily.     Allergies:   Penicillins, Librium [chlordiazepoxide hcl], and Tetracyclines & related   Social History   Socioeconomic History   Marital status: Single    Spouse name: Not on file   Number of children: Not on file   Years of education: Not on file   Highest education level: Not on file  Occupational History   Not on file  Tobacco Use   Smoking status: Never   Smokeless tobacco: Never  Substance and Sexual Activity   Alcohol use: Yes    Comment: occasional   Drug use: Yes   Sexual activity: Yes    Birth control/protection: Post-menopausal  Other Topics Concern   Not on file  Social History Narrative    Not on file   Social Determinants of Health   Financial Resource Strain: Not on file  Food Insecurity: Not on file  Transportation Needs: Not on file  Physical Activity: Not on file  Stress: Not on file  Social Connections: Not on file     Family History: The patient's family history includes Anemia in her mother; Arthritis in her father; Cancer in her father, maternal grandmother, mother, and paternal grandmother; Deep vein thrombosis in her father; Diabetes in her father; Heart disease in her father; Hypertension in her father; Migraines in her mother and paternal grandfather; Stroke in her paternal grandmother.  ROS:   Review of Systems  Constitution: Negative for decreased appetite, fever and weight gain.  HENT: Negative for congestion, ear discharge, hoarse voice and sore throat.   Eyes: Negative for discharge, redness, vision loss in right eye and visual halos.  Cardiovascular: Reports palpitation and chest pain.  Negative for dyspnea on exertion, leg swelling, orthopnea and palpitations.  Respiratory: Negative for cough, hemoptysis, shortness of breath and snoring.   Endocrine: Negative for heat intolerance and polyphagia.  Hematologic/Lymphatic: Negative for bleeding problem. Does not bruise/bleed easily.  Skin: Negative for flushing, nail changes, rash and suspicious lesions.  Musculoskeletal: Negative for arthritis, joint pain, muscle cramps, myalgias, neck pain and stiffness.  Gastrointestinal: Negative for abdominal pain, bowel incontinence, diarrhea and excessive appetite.  Genitourinary: Negative for decreased libido, genital sores and incomplete emptying.  Neurological: Negative for brief paralysis, focal weakness, headaches and loss of balance.  Psychiatric/Behavioral: Negative for altered mental status, depression and suicidal ideas.  Allergic/Immunologic: Negative for HIV exposure and persistent infections.    EKGs/Labs/Other Studies Reviewed:    The following  studies were reviewed today:   EKG:  The ekg ordered today demonstrates Sinus rhythm, heart rate 67 beats a minute.  Recent Labs: No results found for requested labs within last 365 days.  Recent Lipid Panel No results found for: "CHOL", "TRIG", "HDL", "CHOLHDL", "VLDL", "LDLCALC", "LDLDIRECT"  Physical Exam:    VS:  BP 138/78 (BP Location: Right Arm, Patient Position: Sitting, Cuff Size: Normal)   Pulse 67   Ht '5\' 7"'$  (1.702 m)   Wt 138 lb 12.8 oz (63 kg)   SpO2 97%   BMI 21.74 kg/m     Wt Readings from Last 3 Encounters:  06/01/22 138 lb 12.8 oz (63 kg)  09/13/12 138 lb (62.6 kg)  02/16/12 133 lb (60.3 kg)     GEN: Well nourished, well developed in no acute  distress HEENT: Normal NECK: No JVD; No carotid bruits LYMPHATICS: No lymphadenopathy CARDIAC: S1S2 noted,RRR, no murmurs, rubs, gallops RESPIRATORY:  Clear to auscultation without rales, wheezing or rhonchi  ABDOMEN: Soft, non-tender you well, is here you to, non-distended, +bowel sounds, no guarding. EXTREMITIES: Bilateral edema, No cyanosis, no clubbing, +1 pulses bilateral foot MUSCULOSKELETAL:  No deformity  SKIN: Warm and dry NEUROLOGIC:  Alert and oriented x 3, non-focal PSYCHIATRIC:  Normal affect, good insight  ASSESSMENT:    1. Bilateral leg pain   2. Leg swelling   3. Heart palpitations   4. Chest pain, atypical   5. Venous insufficiency of both lower extremities    PLAN:     I would like to rule out a cardiovascular etiology of this palpitation, therefore at this time I would like to placed a zio patch for  14  days. In additon with the atypical chest pain a transthoracic echocardiogram will be ordered to assess LV/RV function and any structural abnormalities. Once these testing have been performed amd reviewed further reccomendations will be made. For now, I do reccomend that the patient goes to the nearest ED if  symptoms recur.  Her bilateral leg pain concerns me with low pulses in the dorsalis  pedis I will like to get bilateral ultrasound arterial Doppler to rule out peripheral artery disease.  The patient is in agreement with the above plan. The patient left the office in stable condition.  The patient will follow up in 4 months or sooner if needed.   Medication Adjustments/Labs and Tests Ordered: Current medicines are reviewed at length with the patient today.  Concerns regarding medicines are outlined above.  Orders Placed This Encounter  Procedures   [place] TERM MONITOR (3-14 DAYS)   EKG 12-Lead   ECHOCARDIOGRAM COMPLETE   VAS [place] LOWER EXTREMITY ARTERIAL DUPLEX   No orders of the defined types were placed in this encounter.   Patient Instructions  Medication Instructions:  Your physician recommends that you continue on your current medications as directed. Please refer to the Current Medication list given to you today.  *If you need a refill on your cardiac medications before your next appointment, please call your pharmacy*   Lab Work: NONE If you have labs (blood work) drawn today and your tests are completely normal, you will receive your results only by: [place] (if you have MyChart) OR A paper copy in the mail If you have any lab test that is abnormal or we need to change your treatment, we will call you to review the results.   Testing/Procedures: Your physician has requested that you have an echocardiogram. Echocardiography is a painless test that uses sound waves to create images of your heart. It provides your doctor with information about the size and shape of your heart and how well your heart's chambers and valves are working. This procedure takes approximately one hour. There are no restrictions for this procedure. Please do NOT wear cologne, perfume, aftershave, or lotions (deodorant is allowed). Please arrive 15 minutes prior to your appointment time.  ZIO XT- [place] Term Monitor Instructions  Your physician has requested you wear a ZIO patch  monitor for 14 days.  This is a single patch monitor. [employer] supplies one patch monitor per enrollment. Additional stickers are not available. Please do not apply patch if you will be having a Nuclear Stress Test,  Echocardiogram, Cardiac CT, MRI, or Chest Xray during the period you would be wearing the  monitor. The patch  cannot be worn during these tests. You cannot remove and re-apply the  ZIO XT patch monitor.  Your ZIO patch monitor will be mailed 3 day USPS to your address on file. It may take 3-5 days  to receive your monitor after you have been enrolled.  Once you have received your monitor, please review the enclosed instructions. Your monitor  has already been registered assigning a specific monitor serial # to you.  Billing and Patient Assistance Program Information  We have supplied [employer] with any of your insurance information on file for billing purposes. [employer] offers a sliding scale Patient Assistance Program for patients that do not have  insurance, or whose insurance does not completely cover the cost of the ZIO monitor.  You must apply for the Patient Assistance Program to qualify for this discounted rate.  To apply, please call [employer] at [phone], select option 4, select option 2, ask to apply for  Patient Assistance Program. Theodore Demark will ask your household income, and how many people  are in your household. They will quote your out-of-pocket cost based on that information.  [employer] will also be able to set up a 1-month interest-free payment plan if needed.  Applying the monitor   Shave hair from upper left chest.  Hold abrader disc by orange tab. Rub abrader in 40 strokes over the upper left chest as  indicated in your monitor instructions.  Clean area with 4 enclosed alcohol pads. Let dry.  Apply patch as indicated in monitor instructions. Patch will be placed under collarbone on left  side of chest with arrow pointing upward.  Rub patch adhesive wings  for 2 minutes. Remove white label marked "1". Remove the white  label marked "2". Rub patch adhesive wings for 2 additional minutes.  While looking in a mirror, press and release button in center of patch. A small green light will  flash 3-4 times. This will be your only indicator that the monitor has been turned on.  Do not shower for the first 24 hours. You may shower after the first 24 hours.  Press the button if you feel a symptom. You will hear a small click. Record Date, Time and  Symptom in the Patient Logbook.  When you are ready to remove the patch, follow instructions on the last 2 pages of Patient  Logbook. Stick patch monitor onto the last page of Patient Logbook.  Place Patient Logbook in the blue and white box. Use locking tab on box and tape box closed  securely. The blue and white box has prepaid postage on it. Please place it in the mailbox as  soon as possible. Your physician should have your test results approximately 7 days after the  monitor [facility]  Call [place] [phone] you have questions regarding  your ZIO XT patch monitor. Call them immediately if you see an orange light blinking on your  monitor.  If your monitor falls off in less than 4 days, contact our Monitor department at [phone]  If your monitor becomes loose or falls off after 4 days call [employer] at [phone]  suggestions on securing your monitor   Your physician has requested that you have a lower extremity arterial duplex. This test is an ultrasound of the arteries in the legs. It looks at arterial blood flow in the legs. Allow one hour for Lower Arterial scan. There are no restrictions or special instructions  Follow-Up: At [facility], you and your health needs are our priority.  As part of our continuing mission to provide you with exceptional heart care, we have created designated Provider Care Teams.  These  Care Teams include your primary Cardiologist (physician) and Advanced Practice Providers (APPs -  Physician Assistants and Nurse Practitioners) who all work together to provide you with the care you need, when you need it.  We recommend signing up for the patient portal called "MyChart".  Sign up information is provided on this After Visit Summary.  MyChart is used to connect with patients for Virtual Visits (Telemedicine).  Patients are able to view lab/test results, encounter notes, upcoming appointments, etc.  Non-urgent messages can be sent to your provider as well.   To learn more about what you can do with MyChart, go to [email or web address].    Your next appointment:   4 month(s)  The format for your next appointment:   In Person  Provider:   Berniece Salines, DO      Adopting a Healthy Lifestyle.  Know what a healthy weight is for you (roughly BMI <25) and aim to maintain this   Aim for 7+ servings of fruits and vegetables daily   65-80+ fluid ounces of water or unsweet tea for healthy kidneys   Limit to max 1 drink of alcohol per day; avoid smoking/tobacco   Limit animal fats in diet for cholesterol and heart health - choose grass fed whenever available   Avoid highly processed foods, and foods high in saturated/trans fats   Aim for low stress - take time to unwind and care for your mental health   Aim for 150 min of moderate intensity exercise weekly for heart health, and weights twice weekly for bone health   Aim for 7-9 hours of sleep daily   When it comes to diets, agreement about the perfect plan isnt easy to find, even among the experts. Experts at the [employer] developed an idea known as the Healthy Eating Plate. Just imagine a plate divided into logical, healthy portions.   The emphasis is on diet quality:   Load up on vegetables and fruits - one-half of your plate: Aim for color and variety, and remember that potatoes dont count.   Go  for whole grains - one-quarter of your plate: Whole wheat, barley, wheat berries, quinoa, oats, brown rice, and foods made with them. If you want pasta, go with whole wheat pasta.   Protein power - one-quarter of your plate: Fish, chicken, beans, and nuts are all healthy, versatile protein sources. Limit red meat.   The diet, however, does go beyond the plate, offering a few other suggestions.   Use healthy plant oils, such as olive, canola, soy, corn, sunflower and peanut. Check the labels, and avoid partially hydrogenated oil, which have unhealthy trans fats.   If youre thirsty, drink water. Coffee and tea are good in moderation, but skip sugary drinks and limit milk and dairy products to one or two daily servings.   The type of carbohydrate in the diet is more important than the amount. Some sources of carbohydrates, such as vegetables, fruits, whole grains, and beans-are healthier than others.   Finally, stay active  Signed, Berniece Salines, DO  06/01/2022 2:38 PM    [place]

## 2022-06-01 NOTE — Patient Instructions (Signed)
Medication Instructions:  Your physician recommends that you continue on your current medications as directed. Please refer to the Current Medication list given to you today.  *If you need a refill on your cardiac medications before your next appointment, please call your pharmacy*   Lab Work: NONE If you have labs (blood work) drawn today and your tests are completely normal, you will receive your results only by: [place] (if you have MyChart) OR A paper copy in the mail If you have any lab test that is abnormal or we need to change your treatment, we will call you to review the results.   Testing/Procedures: Your physician has requested that you have an echocardiogram. Echocardiography is a painless test that uses sound waves to create images of your heart. It provides your doctor with information about the size and shape of your heart and how well your heart's chambers and valves are working. This procedure takes approximately one hour. There are no restrictions for this procedure. Please do NOT wear cologne, perfume, aftershave, or lotions (deodorant is allowed). Please arrive 15 minutes prior to your appointment time.  ZIO XT- [place] Term Monitor Instructions  Your physician has requested you wear a ZIO patch monitor for 14 days.  This is a single patch monitor. [employer] supplies one patch monitor per enrollment. Additional stickers are not available. Please do not apply patch if you will be having a Nuclear Stress Test,  Echocardiogram, Cardiac CT, MRI, or Chest Xray during the period you would be wearing the  monitor. The patch cannot be worn during these tests. You cannot remove and re-apply the  ZIO XT patch monitor.  Your ZIO patch monitor will be mailed 3 day USPS to your address on file. It may take 3-5 days  to receive your monitor after you have been enrolled.  Once you have received your monitor, please review the enclosed instructions. Your monitor  has already been  registered assigning a specific monitor serial # to you.  Billing and Patient Assistance Program Information  We have supplied [employer] with any of your insurance information on file for billing purposes. [employer] offers a sliding scale Patient Assistance Program for patients that do not have  insurance, or whose insurance does not completely cover the cost of the ZIO monitor.  You must apply for the Patient Assistance Program to qualify for this discounted rate.  To apply, please call [employer] at [phone], select option 4, select option 2, ask to apply for  Patient Assistance Program. Theodore Demark will ask your household income, and how many people  are in your household. They will quote your out-of-pocket cost based on that information.  [employer] will also be able to set up a 29-month interest-free payment plan if needed.  Applying the monitor   Shave hair from upper left chest.  Hold abrader disc by orange tab. Rub abrader in 40 strokes over the upper left chest as  indicated in your monitor instructions.  Clean area with 4 enclosed alcohol pads. Let dry.  Apply patch as indicated in monitor instructions. Patch will be placed under collarbone on left  side of chest with arrow pointing upward.  Rub patch adhesive wings for 2 minutes. Remove white label marked "1". Remove the white  label marked "2". Rub patch adhesive wings for 2 additional minutes.  While looking in a mirror, press and release button in center of patch. A small green light will  flash 3-4 times. This will be your only indicator that  the monitor has been turned on.  Do not shower for the first 24 hours. You may shower after the first 24 hours.  Press the button if you feel a symptom. You will hear a small click. Record Date, Time and  Symptom in the Patient Logbook.  When you are ready to remove the patch, follow instructions on the last 2 pages of Patient  Logbook. Stick patch monitor onto the last page of Patient  Logbook.  Place Patient Logbook in the blue and white box. Use locking tab on box and tape box closed  securely. The blue and white box has prepaid postage on it. Please place it in the mailbox as  soon as possible. Your physician should have your test results approximately 7 days after the  monitor has been mailed back to [facility]  Call [place] at [phone] if you have questions regarding  your ZIO XT patch monitor. Call them immediately if you see an orange light blinking on your  monitor.  If your monitor falls off in less than 4 days, contact our Monitor department at [phone].  If your monitor becomes loose or falls off after 4 days call [employer] at [phone] for  suggestions on securing your monitor   Your physician has requested that you have a lower extremity arterial duplex. This test is an ultrasound of the arteries in the legs. It looks at arterial blood flow in the legs. Allow one hour for Lower Arterial scan. There are no restrictions or special instructions       Follow-Up: At [facility], you and your health needs are our priority.  As part of our continuing mission to provide you with exceptional heart care, we have created designated Provider Care Teams.  These Care Teams include your primary Cardiologist (physician) and Advanced Practice Providers (APPs -  Physician Assistants and Nurse Practitioners) who all work together to provide you with the care you need, when you need it.  We recommend signing up for the patient portal called "MyChart".  Sign up information is provided on this After Visit Summary.  MyChart is used to connect with patients for Virtual Visits (Telemedicine).  Patients are able to view lab/test results, encounter notes, upcoming appointments, etc.  Non-urgent messages can be sent to your provider as well.   To learn more about what you can do with MyChart, go to [email or web address].    Your next  appointment:   4 month(s)  The format for your next appointment:   In Person  Provider:   Berniece Salines, DO

## 2022-06-01 NOTE — Progress Notes (Unsigned)
Enrolled patient for a 14 day Zio XT  monitor to be mailed to patients home  °

## 2022-06-03 DIAGNOSIS — R002 Palpitations: Secondary | ICD-10-CM

## 2022-06-08 ENCOUNTER — Telehealth: Payer: Self-pay | Admitting: Cardiology

## 2022-06-08 NOTE — Telephone Encounter (Signed)
Spoke to patient she stated swelling in right lower leg became worse this past Sunday.Stated no pain,lower leg feels tight.Stated Dr.Tobb noticed swelling at her last visit 11/28.I will send message to Dr.Tobb for advice.

## 2022-06-08 NOTE — Telephone Encounter (Signed)
Pt c/o swelling: STAT is pt has developed SOB within 24 hours  If swelling, where is the swelling located? Right leg   How much weight have you gained and in what time span? no  Have you gained 3 pounds in a day or 5 pounds in a week? no  Do you have a log of your daily weights (if so, list)? Did not take   Are you currently taking a fluid pill? No   Are you currently SOB? No   Have you traveled recently? No  Pt states at her last OV Dr. Harriet Masson noticed her leg was swelling but now it is worse. She denies any SOB.

## 2022-06-13 ENCOUNTER — Encounter: Payer: Self-pay | Admitting: Cardiology

## 2022-06-21 ENCOUNTER — Other Ambulatory Visit: Payer: Self-pay | Admitting: Cardiology

## 2022-06-21 DIAGNOSIS — M79604 Pain in right leg: Secondary | ICD-10-CM

## 2022-06-22 ENCOUNTER — Ambulatory Visit (HOSPITAL_COMMUNITY): Admission: RE | Admit: 2022-06-22 | Payer: No Typology Code available for payment source | Source: Ambulatory Visit

## 2022-06-22 ENCOUNTER — Other Ambulatory Visit (HOSPITAL_COMMUNITY): Payer: No Typology Code available for payment source

## 2022-06-30 ENCOUNTER — Ambulatory Visit (HOSPITAL_COMMUNITY)
Admission: RE | Admit: 2022-06-30 | Discharge: 2022-06-30 | Disposition: A | Discharge status: Home / Self Care | Payer: Medicare Other | Source: Ambulatory Visit | Attending: Cardiovascular Disease | Admitting: Cardiovascular Disease

## 2022-06-30 DIAGNOSIS — M79605 Pain in left leg: Secondary | ICD-10-CM

## 2022-06-30 DIAGNOSIS — M79604 Pain in right leg: Secondary | ICD-10-CM

## 2022-07-01 NOTE — Telephone Encounter (Signed)
Tried to call pt to discuss, Left message to call back.

## 2022-07-02 MED ORDER — FUROSEMIDE 40 MG PO TABS: 40.0000 mg | ORAL_TABLET | Freq: Every day | ORAL | 0 refills | Status: DC

## 2022-07-02 MED ORDER — POTASSIUM CHLORIDE CRYS ER 20 MEQ PO TBCR: 20.0000 meq | EXTENDED_RELEASE_TABLET | Freq: Every day | ORAL | 0 refills | Status: DC

## 2022-07-02 NOTE — Addendum Note (Signed)
Addended by: Waylan Rocher on: 07/02/2022 11:20 AM   Modules accepted: Orders

## 2022-07-02 NOTE — Telephone Encounter (Signed)
Pt informed of providers result & recommendations. Pt verbalized understanding. Sent new rx's to requested pharmacy. She states that she will "think about it" discussed swelling with pt. Then she states OK she will take.

## 2022-07-13 ENCOUNTER — Telehealth: Payer: No Typology Code available for payment source | Admitting: Cardiology

## 2022-07-21 ENCOUNTER — Ambulatory Visit (HOSPITAL_COMMUNITY): Payer: Medicare Other | Attending: Cardiology

## 2022-07-21 ENCOUNTER — Ambulatory Visit: Payer: No Typology Code available for payment source | Admitting: Cardiology

## 2022-07-21 DIAGNOSIS — R0789 Other chest pain: Secondary | ICD-10-CM | POA: Diagnosis present

## 2022-07-21 LAB — ECHOCARDIOGRAM COMPLETE
Area-P 1/2: 3.12 cm2
S' Lateral: 2.4 cm

## 2022-07-23 ENCOUNTER — Telehealth: Payer: Self-pay

## 2022-07-23 ENCOUNTER — Ambulatory Visit: Payer: Medicare Other | Admitting: Cardiology

## 2022-07-23 NOTE — Telephone Encounter (Signed)
Spoke with pt. She is upset because she did not know she needed to have a blood pressure cuff. Pt states "I've never had a blood pressure cuff. Nobody ever told me I needed a blood pressure cuff. Do you know how incompetent that makes you all look?" Pt was reassured she needed   to be seen in office due to the continued swelling. She states "swelling is not my issue. She prescribed Lasix for only 5 days and it did not work. I just don't understand why not all of a sudden she needs to see me in office." Pt's frustration acknowledged. Pt made aware since the medication did not work she should be seen in office, she verbalized understanding but continues to be upset. She states "I cleared my whole morning and at 11 she went in with another patient? That is my time." Apologized to pt for the inconvenience and offered to reschedule her for in office due to her not having a blood pressure cuff and having continued swelling. Offered her  1/30 at 3pm she states "well I guess I'll be available." Pt scheduled for 1/30 with Dr. Harriet Masson in office. MyChart message sent to pt for confirmation.

## 2022-08-03 ENCOUNTER — Ambulatory Visit: Payer: Medicare Other | Attending: Cardiology | Admitting: Cardiology

## 2022-08-03 ENCOUNTER — Encounter: Payer: Self-pay | Admitting: Cardiology

## 2022-08-03 VITALS — BP 124/70 | HR 65 | Ht 67.0 in | Wt 139.0 lb

## 2022-08-03 DIAGNOSIS — I872 Venous insufficiency (chronic) (peripheral): Secondary | ICD-10-CM | POA: Diagnosis not present

## 2022-08-03 DIAGNOSIS — I471 Supraventricular tachycardia, unspecified: Secondary | ICD-10-CM

## 2022-08-03 MED ORDER — METOPROLOL SUCCINATE ER 25 MG PO TB24: 12.5000 mg | ORAL_TABLET | Freq: Every day | ORAL | 3 refills | Status: DC

## 2022-08-03 NOTE — Patient Instructions (Addendum)
Medication Instructions:  Your physician has recommended you make the following change in your medication:  START: Toprol -XL 12.5 mg once daily  *If you need a refill on your cardiac medications before your next appointment, please call your pharmacy*   Lab Work: None  Testing/Procedures: None   Follow-Up: At [facility], you and your health needs are our priority.  As part of our continuing mission to provide you with exceptional heart care, we have created designated Provider Care Teams.  These Care Teams include your primary Cardiologist (physician) and Advanced Practice Providers (APPs -  Physician Assistants and Nurse Practitioners) who all work together to provide you with the care you need, when you need it.  We recommend signing up for the patient portal called "MyChart".  Sign up information is provided on this After Visit Summary.  MyChart is used to connect with patients for Virtual Visits (Telemedicine).  Patients are able to view lab/test results, encounter notes, upcoming appointments, etc.  Non-urgent messages can be sent to your provider as well.   To learn more about what you can do with MyChart, go to [email or web address].    Your next appointment:   6 month(s)  Provider:   Berniece Salines, DO    Other instructions: Please get compression socks (medical grade) 10 mmHg compression.

## 2022-08-08 NOTE — Progress Notes (Signed)
Cardiology Office Note:    Date:  08/08/2022   ID:  Misty Owens, DOB 03-19-1956, MRN [id number]  PCP:  Pcp, No  Cardiologist:  Berniece Salines, DO  Electrophysiologist:  None   Referring MD: No ref. provider found   " I am palpitation"   History of Present Illness:    Misty Owens is a 67 y.o. female with a hx of hepatitis C infection, asthma, her today for a follow up visit.   At her last visit she complained of palpitations and leg swelling. I ordered a monitor, echo and lowe extremity doppler.   She is here to discuss her results.   Past Medical History:  Diagnosis Date   Abnormal Pap smear    1967, cryotherapy   Anemia    Asthma    Bartholin cyst    Bartholin's Abscess   Fibroid    Gonorrhea    H/O varicella    Headache(784.0)    Hepatitis C    Hepatitis C infection    Herpes simplex without mention of complication    History of blood transfusion    History of measles, mumps, or rubella    History of varicose veins    Hx of migraine headaches    Marijuana use    Migraine    Postpartum depression    Wisdom teeth extracted     Past Surgical History:  Procedure Laterality Date   CLAVICLE EXCISION     CYSTECTOMY  1976   2 OVARIAN CYST 3 YEARS APART   DILATION AND CURETTAGE OF UTERUS  2010   menorrhagia & endometrial polyp   WISDOM TOOTH EXTRACTION      Current Medications: Current Meds  Medication Sig   AMBULATORY NON FORMULARY MEDICATION Place 0.75 mg onto the skin every morning. Medication Name: Testosterone 0.75 mg/1/2 mL apply between thighs daily   AMBULATORY NON FORMULARY MEDICATION Take 5 mg by mouth daily. Medication Name: DHEA  5 mg 1 po daily   Ashwagandha Extract 2.5 % POWD by Does not apply route.   b complex vitamins tablet Take 1 tablet by mouth daily.   Coenzyme Q10 (CO Q 10 PO) Take by mouth.   Cyanocobalamin (VITAMIN B 12 PO) Take by mouth.   estradiol (ESTRACE) 1 MG tablet 1 tablet Orally Once a day for 30 day(s)   fish oil-omega-3 fatty  acids 1000 MG capsule Take 2 g by mouth daily.   magnesium 30 MG tablet Take 30 mg by mouth 2 (two) times daily.   metoprolol succinate (TOPROL XL) 25 MG 24 hr tablet Take 0.5 tablets (12.5 mg total) by mouth daily.   MILK THISTLE PO Take by mouth.   Multiple Vitamin (MULTIVITAMIN) tablet Take 1 tablet by mouth daily.   NON FORMULARY PT TAKE PROGESTERONE CREAM 100 MG BID,TESTOSTERONE .'5MG'$ , DHEA 10 4 X A WEEK, AND BI-ESTROGEN 2.'2MG'$ 1X EVERY OTHER DAY.   Polyethyl Glyc-Propyl Glyc PF (SYSTANE PRESERVATIVE FREE) 0.4-0.3 % SOLN    UNABLE TO FIND PT TAKE FOOD ENZYMES   valACYclovir (VALTREX) 500 MG tablet Take 500 mg by mouth 2 (two) times daily.     Allergies:   Penicillins, Librium [chlordiazepoxide hcl], and Tetracyclines & related   Social History   Socioeconomic History   Marital status: Single    Spouse name: Not on file   Number of children: Not on file   Years of education: Not on file   Highest education level: Not on file  Occupational History   Not on file  Tobacco Use   Smoking status: Never   Smokeless tobacco: Never  Substance and Sexual Activity   Alcohol use: Yes    Comment: occasional   Drug use: Yes   Sexual activity: Yes    Birth control/protection: Post-menopausal  Other Topics Concern   Not on file  Social History Narrative   Not on file   Social Determinants of Health   Financial Resource Strain: Not on file  Food Insecurity: Not on file  Transportation Needs: Not on file  Physical Activity: Not on file  Stress: Not on file  Social Connections: Not on file     Family History: The patient's family history includes Anemia in her mother; Arthritis in her father; Cancer in her father, maternal grandmother, mother, and paternal grandmother; Deep vein thrombosis in her father; Diabetes in her father; Heart disease in her father; Hypertension in her father; Migraines in her mother and paternal grandfather; Stroke in her paternal grandmother.  ROS:   Review  of Systems  Constitution: Negative for decreased appetite, fever and weight gain.  HENT: Negative for congestion, ear discharge, hoarse voice and sore throat.   Eyes: Negative for discharge, redness, vision loss in right eye and visual halos.  Cardiovascular: Reports palpitation and chest pain.  Negative for dyspnea on exertion, leg swelling, orthopnea and palpitations.  Respiratory: Negative for cough, hemoptysis, shortness of breath and snoring.   Endocrine: Negative for heat intolerance and polyphagia.  Hematologic/Lymphatic: Negative for bleeding problem. Does not bruise/bleed easily.  Skin: Negative for flushing, nail changes, rash and suspicious lesions.  Musculoskeletal: Negative for arthritis, joint pain, muscle cramps, myalgias, neck pain and stiffness.  Gastrointestinal: Negative for abdominal pain, bowel incontinence, diarrhea and excessive appetite.  Genitourinary: Negative for decreased libido, genital sores and incomplete emptying.  Neurological: Negative for brief paralysis, focal weakness, headaches and loss of balance.  Psychiatric/Behavioral: Negative for altered mental status, depression and suicidal ideas.  Allergic/Immunologic: Negative for HIV exposure and persistent infections.    EKGs/Labs/Other Studies Reviewed:    The following studies were reviewed today:   EKG:  none today  06/22/2022 Patch Wear Time:  13 days and 10 hours (2023-11-30T23:06:59-0500 to 2023-12-14T09:48:42-0500) Patient had a min HR of 60 bpm, max HR of 200 bpm, and avg HR of 83 bpm. Predominant underlying rhythm was Sinus Rhythm. 10 Supraventricular Tachycardia runs occurred, the run with the fastest interval lasting 13 beats with a max rate of 200 bpm, the  longest lasting 16.1 secs with an avg rate of 113 bpm. Isolated SVEs were rare (<1.0%), SVE Couplets were rare (<1.0%), and SVE Triplets were rare (<1.0%). Isolated VEs were rare (<1.0%), VE Couplets were rare (<1.0%), and no VE Triplets were  present.  Symptoms was associated sinus rhythm.    Conclusion: The study showed the following:            1. Asymptomatic paroxysmal Supraventricular tachycardia  Lower extremity doppler 06/2022 Comparison Study: None   Performing Technologist: Salvadore Dom RVT  Examination Guidelines: A complete evaluation includes at minimum, Doppler  waveform signals and systolic blood pressure reading at the level of  bilateral  brachial, anterior tibial, and posterior tibial arteries, when vessel  segments  are accessible. Bilateral testing is considered an integral part of a  complete  examination. Photoelectric Plethysmograph (PPG) waveforms and toe systolic  pressure readings are included as required and additional duplex testing  as  needed. Limited examinations for reoccurring indications may be performed  as  noted.  ABI Findings:  +---------+------------------+-----+---------+--------+  Right   Rt Pressure (mmHg)IndexWaveform Comment   +---------+------------------+-----+---------+--------+  Brachial 125                                       +---------+------------------+-----+---------+--------+  CFA                            triphasic          +---------+------------------+-----+---------+--------+  Popliteal                      biphasic           +---------+------------------+-----+---------+--------+  PTA     150               1.03 triphasic          +---------+------------------+-----+---------+--------+  PERO    137               0.94 triphasic          +---------+------------------+-----+---------+--------+  DP      138               0.95 biphasic           +---------+------------------+-----+---------+--------+  Cleotis Nipper               0.87 Normal             +---------+------------------+-----+---------+--------+   +---------+------------------+-----+---------+-------+  Left    Lt Pressure  (mmHg)IndexWaveform Comment  +---------+------------------+-----+---------+-------+  Brachial 146                                      +---------+------------------+-----+---------+-------+  CFA                            triphasic         +---------+------------------+-----+---------+-------+  Popliteal                      biphasic          +---------+------------------+-----+---------+-------+  PTA     152               1.04 triphasic         +---------+------------------+-----+---------+-------+  PERO    154               1.23 triphasic         +---------+------------------+-----+---------+-------+  DP      159               1.09 triphasic         +---------+------------------+-----+---------+-------+  Great Toe108               0.74 Normal            +---------+------------------+-----+---------+-------+   +-------+-----------+-----------+------------+------------+  ABI/TBIToday's ABIToday's TBIPrevious ABIPrevious TBI  +-------+-----------+-----------+------------+------------+  Right 1.03       .87                                  +-------+-----------+-----------+------------+------------+  Left  1.09       .74                                  +-------+-----------+-----------+------------+------------+  Summary:  Right: Resting right ankle-brachial index is within normal range. The  right toe-brachial index is normal.   Left: Resting left ankle-brachial index is within normal range. The left  toe-brachial index is normal.    *See table(s) above for measurements and observations.      TTE 07/21/2022 IMPRESSIONS   1. Left ventricular ejection fraction, by estimation, is 60 to 65%. The  left ventricle has normal function. The left ventricle has no regional  wall motion abnormalities. Left ventricular diastolic parameters were  normal. The average left ventricular  global longitudinal strain is  -20.1 %. The global longitudinal strain is  normal.   2. Right ventricular systolic function is normal. The right ventricular  size is normal. There is normal pulmonary artery systolic pressure.   3. The mitral valve is normal in structure. Trivial mitral valve  regurgitation. No evidence of mitral stenosis.   4. The aortic valve is grossly normal. There is mild calcification of the  aortic valve. Aortic valve regurgitation is not visualized. Aortic valve  sclerosis is present, with no evidence of aortic valve stenosis.   5. The inferior vena cava is normal in size with <50% respiratory  variability, suggesting right atrial pressure of 8 mmHg.   Comparison(s): No prior Echocardiogram.   Conclusion(s)/Recommendation(s): Normal biventricular function without  evidence of hemodynamically significant valvular heart disease.    Recent Labs: No results found for requested labs within last 365 days.  Recent Lipid Panel No results found for: "CHOL", "TRIG", "HDL", "CHOLHDL", "VLDL", "LDLCALC", "LDLDIRECT"  Physical Exam:    VS:  BP 124/70   Pulse 65   Ht '5\' 7"'$  (1.702 m)   Wt 63 kg   SpO2 98%   BMI 21.77 kg/m     Wt Readings from Last 3 Encounters:  08/03/22 63 kg  06/01/22 63 kg  09/13/12 62.6 kg     GEN: Well nourished, well developed in no acute distress HEENT: Normal NECK: No JVD; No carotid bruits LYMPHATICS: No lymphadenopathy CARDIAC: S1S2 noted,RRR, no murmurs, rubs, gallops RESPIRATORY:  Clear to auscultation without rales, wheezing or rhonchi  ABDOMEN: Soft, non-tender you well, is here you to, non-distended, +bowel sounds, no guarding. EXTREMITIES: Bilateral edema, No cyanosis, no clubbing, +1 pulses bilateral foot MUSCULOSKELETAL:  No deformity  SKIN: Warm and dry NEUROLOGIC:  Alert and oriented x 3, non-focal PSYCHIATRIC:  Normal affect, good insight  ASSESSMENT:    1. Venous insufficiency   2. PSVT (paroxysmal supraventricular tachycardia)    PLAN:   We  discussed her test result - monitor showed psvt. She will benefit from low dose beta blocker. Toprol xl 12.5 mg daily. She has reluctantly accepted this medication. I will not be surprised if she has not started it by the time she follows up. We discussed the medication in great depth.   She does have evidence of venous insufficiency which I believe is leading to her leg swelling. She is not willing to try diuretics but the swelling leading to lower extremity pain. No arterial involvement arterial doppler negative. Encourage used of compression stocking will refer to Vein&vascular.   She was very emotional during the visit  - given her result noting she has not has any medical problems. However she expressed understanding of the plan of care.  Medication Adjustments/Labs and Tests Ordered: Current medicines are reviewed at length with the patient today.  Concerns regarding medicines are outlined above.  Orders Placed This Encounter  Procedures   Ambulatory referral to Vascular  Surgery   Meds ordered this encounter  Medications   metoprolol succinate (TOPROL XL) 25 MG 24 hr tablet    Sig: Take 0.5 tablets (12.5 mg total) by mouth daily.    Dispense:  45 tablet    Refill:  3    Patient Instructions  Medication Instructions:  Your physician has recommended you make the following change in your medication:  START: Toprol -XL 12.5 mg once daily  *If you need a refill on your cardiac medications before your next appointment, please call your pharmacy*   Lab Work: None  Testing/Procedures: None   Follow-Up: At [facility], you and your health needs are our priority.  As part of our continuing mission to provide you with exceptional heart care, we have created designated Provider Care Teams.  These Care Teams include your primary Cardiologist (physician) and Advanced Practice Providers (APPs -  Physician Assistants and Nurse Practitioners) who all work together to provide you  with the care you need, when you need it.  We recommend signing up for the patient portal called "MyChart".  Sign up information is provided on this After Visit Summary.  MyChart is used to connect with patients for Virtual Visits (Telemedicine).  Patients are able to view lab/test results, encounter notes, upcoming appointments, etc.  Non-urgent messages can be sent to your provider as well.   To learn more about what you can do with MyChart, go to [email or web address].    Your next appointment:   6 month(s)  Provider:   Berniece Salines, DO    Other instructions: Please get compression socks (medical grade) 10 mmHg compression.      Adopting a Healthy Lifestyle.  Know what a healthy weight is for you (roughly BMI <25) and aim to maintain this   Aim for 7+ servings of fruits and vegetables daily   65-80+ fluid ounces of water or unsweet tea for healthy kidneys   Limit to max 1 drink of alcohol per day; avoid smoking/tobacco   Limit animal fats in diet for cholesterol and heart health - choose grass fed whenever available   Avoid highly processed foods, and foods high in saturated/trans fats   Aim for low stress - take time to unwind and care for your mental health   Aim for 150 min of moderate intensity exercise weekly for heart health, and weights twice weekly for bone health   Aim for 7-9 hours of sleep daily   When it comes to diets, agreement about the perfect plan isnt easy to find, even among the experts. Experts at the [place] developed an idea known as the Healthy Eating Plate. Just imagine a plate divided into logical, healthy portions.   The emphasis is on diet quality:   Load up on vegetables and fruits - one-half of your plate: Aim for color and variety, and remember that potatoes dont count.   Go for whole grains - one-quarter of your plate: Whole wheat, barley, wheat berries, quinoa, oats, brown rice, and foods made with them. If you  want pasta, go with whole wheat pasta.   Protein power - one-quarter of your plate: Fish, chicken, beans, and nuts are all healthy, versatile protein sources. Limit red meat.   The diet, however, does go beyond the plate, offering a few other suggestions.   Use healthy plant oils, such as olive, canola, soy, corn, sunflower and peanut. Check the labels, and avoid partially hydrogenated oil, which have unhealthy trans fats.  If youre thirsty, drink water. Coffee and tea are good in moderation, but skip sugary drinks and limit milk and dairy products to one or two daily servings.   The type of carbohydrate in the diet is more important than the amount. Some sources of carbohydrates, such as vegetables, fruits, whole grains, and beans-are healthier than others.   Finally, stay active  Signed, Berniece Salines, DO  08/08/2022 6:24 PM    [facility]

## 2022-08-11 ENCOUNTER — Other Ambulatory Visit: Payer: Self-pay | Admitting: *Deleted

## 2022-08-11 DIAGNOSIS — I872 Venous insufficiency (chronic) (peripheral): Secondary | ICD-10-CM

## 2022-08-23 ENCOUNTER — Ambulatory Visit (HOSPITAL_COMMUNITY)
Admission: RE | Admit: 2022-08-23 | Discharge: 2022-08-23 | Disposition: A | Discharge status: Home / Self Care | Payer: Medicare Other | Source: Ambulatory Visit | Attending: Surgery | Admitting: Surgery

## 2022-08-23 ENCOUNTER — Encounter: Payer: Self-pay | Admitting: Vascular Surgery

## 2022-08-23 ENCOUNTER — Ambulatory Visit: Payer: Medicare Other | Admitting: Vascular Surgery

## 2022-08-23 VITALS — BP 124/81 | HR 72 | Temp 97.8°F | Resp 14 | Ht 67.5 in | Wt 138.0 lb

## 2022-08-23 DIAGNOSIS — I872 Venous insufficiency (chronic) (peripheral): Secondary | ICD-10-CM | POA: Diagnosis present

## 2022-08-24 NOTE — Progress Notes (Signed)
Office Note     CC: Bilateral lower extremity chronic venous insufficiency Requesting Provider:  Berniece Salines, DO  HPI: Misty Owens is a 67 y.o. (12-Nov-1955) female who presents at the request of Pcp, No for evaluation of bilateral lower extremity chronic venous insufficiency.  On exam today, Otho Perl was doing well.  A native of [place], she has lived there her entire life.  She runs a small business, selling mid-century modern items.  Over the last several months, she has appreciated bilateral lower extremity swelling, right greater than left, specifically at the level of the ankle which extends to the level of the knee.  This is worse by days end.  She notes heavy, tired feeling, denies bleeding, ulceration, varicose veins.  She does have some telangiectasias.  She has worn non-medical grade compression stockings, and says that they helped some, but has to wear a certain pair of comfortable shoes due to the swelling.  No swelling that extends onto the dorsal aspect of the foot.  Karmina denies symptoms of claudication, ischemic rest pain, tissue loss.  She has no history of DVT, no history of previous vein procedures.   Past Medical History:  Diagnosis Date   Abnormal Pap smear    1967, cryotherapy   Anemia    Asthma    Bartholin cyst    Bartholin's Abscess   Fibroid    Gonorrhea    H/O varicella    Headache(784.0)    Hepatitis C    Hepatitis C infection    Herpes simplex without mention of complication    History of blood transfusion    History of measles, mumps, or rubella    History of varicose veins    Hx of migraine headaches    Marijuana use    Migraine    Postpartum depression    Wisdom teeth extracted     Past Surgical History:  Procedure Laterality Date   CLAVICLE EXCISION     CYSTECTOMY  1976   2 OVARIAN CYST 3 YEARS APART   DILATION AND CURETTAGE OF UTERUS  2010   menorrhagia & endometrial polyp   WISDOM TOOTH EXTRACTION      Social History    Socioeconomic History   Marital status: Single    Spouse name: Not on file   Number of children: Not on file   Years of education: Not on file   Highest education level: Not on file  Occupational History   Not on file  Tobacco Use   Smoking status: Never   Smokeless tobacco: Never  Substance and Sexual Activity   Alcohol use: Yes    Comment: occasional   Drug use: Yes   Sexual activity: Yes    Birth control/protection: Post-menopausal  Other Topics Concern   Not on file  Social History Narrative   Not on file   Social Determinants of Health   Financial Resource Strain: Not on file  Food Insecurity: Not on file  Transportation Needs: Not on file  Physical Activity: Not on file  Stress: Not on file  Social Connections: Not on file  Intimate Partner Violence: Not on file   Family History  Problem Relation Age of Onset   Cancer Maternal Grandmother        bone   Heart disease Father    Cancer Father        skin   Hypertension Father    Deep vein thrombosis Father    Diabetes Father    Arthritis Father  Cancer Mother        stomach and skin   Anemia Mother    Migraines Mother    Migraines Paternal Grandfather    Cancer Paternal Grandmother        uterine?   Stroke Paternal Grandmother     Current Outpatient Medications  Medication Sig Dispense Refill   AMBULATORY NON FORMULARY MEDICATION Place 0.75 mg onto the skin every morning. Medication Name: Testosterone 0.75 mg/1/2 mL apply between thighs daily 30 mL 4   AMBULATORY NON FORMULARY MEDICATION Take 5 mg by mouth daily. Medication Name: DHEA  5 mg 1 po daily 30 capsule 11   Ashwagandha Extract 2.5 % POWD by Does not apply route.     b complex vitamins tablet Take 1 tablet by mouth daily.     Coenzyme Q10 (CO Q 10 PO) Take by mouth.     Cyanocobalamin (VITAMIN B 12 PO) Take by mouth.     estradiol (ESTRACE) 1 MG tablet 1 tablet Orally Once a day for 30 day(s)     fish oil-omega-3 fatty acids 1000 MG  capsule Take 2 g by mouth daily.     magnesium 30 MG tablet Take 30 mg by mouth 2 (two) times daily.     metoprolol succinate (TOPROL XL) 25 MG 24 hr tablet Take 0.5 tablets (12.5 mg total) by mouth daily. 45 tablet 3   MILK THISTLE PO Take by mouth.     Multiple Vitamin (MULTIVITAMIN) tablet Take 1 tablet by mouth daily.     NON FORMULARY PT TAKE PROGESTERONE CREAM 100 MG BID,TESTOSTERONE .5MG, DHEA 10 4 X A WEEK, AND BI-ESTROGEN 2.2MG1X EVERY OTHER DAY.     Polyethyl Glyc-Propyl Glyc PF (SYSTANE PRESERVATIVE FREE) 0.4-0.3 % SOLN      UNABLE TO FIND PT TAKE FOOD ENZYMES     valACYclovir (VALTREX) 500 MG tablet Take 500 mg by mouth 2 (two) times daily.     No current facility-administered medications for this visit.    Allergies  Allergen Reactions   Penicillins Hives   Librium [Chlordiazepoxide Hcl]    Tetracyclines & Related      REVIEW OF SYSTEMS:  [X]$  denotes positive finding, [ ]$  denotes negative finding Cardiac  Comments:  Chest pain or chest pressure:    Shortness of breath upon exertion:    Short of breath when lying flat:    Irregular heart rhythm:        Vascular    Pain in calf, thigh, or hip brought on by ambulation:    Pain in feet at night that wakes you up from your sleep:     Blood clot in your veins:    Leg swelling:         Pulmonary    Oxygen at home:    Productive cough:     Wheezing:         Neurologic    Sudden weakness in arms or legs:     Sudden numbness in arms or legs:     Sudden onset of difficulty speaking or slurred speech:    Temporary loss of vision in one eye:     Problems with dizziness:         Gastrointestinal    Blood in stool:     Vomited blood:         Genitourinary    Burning when urinating:     Blood in urine:        Psychiatric    Major  depression:         Hematologic    Bleeding problems:    Problems with blood clotting too easily:        Skin    Rashes or ulcers:        Constitutional    Fever or chills:       PHYSICAL EXAMINATION:  Vitals:   08/23/22 1450  BP: 124/81  Pulse: 72  Resp: 14  Temp: 97.8 F (36.6 C)  TempSrc: Temporal  SpO2: 95%  Weight: 138 lb (62.6 kg)  Height: 5' 7.5" (1.715 m)    General:  WDWN in NAD; vital signs documented above Gait: Not observed HENT: WNL, normocephalic Pulmonary: normal non-labored breathing , without Rales, rhonchi,  wheezing Cardiac: regular HR Abdomen: soft, NT, no masses Skin: without rashes Vascular Exam/Pulses:  Right Left  Radial 2+ (normal) 2+ (normal)  Ulnar 2+ (normal) 2+ (normal)  Femoral    Popliteal    DP 2+ (normal) 2+ (normal)  PT 2+ (normal) 2+ (normal)   Extremities: without ischemic changes, without Gangrene , without cellulitis; without open wounds;  Bilateral lower extremity edema, especially appreciated at the level of the ankle Musculoskeletal: no muscle wasting or atrophy  Neurologic: A&O X 3;  No focal weakness or paresthesias are detected Psychiatric:  The pt has Normal affect.   Non-Invasive Vascular Imaging:   Venous Reflux Times  +--------------+---------+------+-----------+------------+-------------+  RIGHT        Reflux NoRefluxReflux TimeDiameter cmsComments                               Yes                                        +--------------+---------+------+-----------+------------+-------------+  CFV                    yes   >1 second                            +--------------+---------+------+-----------+------------+-------------+  FV mid        no                                                   +--------------+---------+------+-----------+------------+-------------+  Popliteal    no                                                   +--------------+---------+------+-----------+------------+-------------+  GSV at [facility]    no                            0.64                   +--------------+---------+------+-----------+------------+-------------+   GSV prox thighno                            0.34                   +--------------+---------+------+-----------+------------+-------------+  GSV mid thigh no                            0.27    out of fascia  +--------------+---------+------+-----------+------------+-------------+  GSV dist thighno                            0.23    out of fascia  +--------------+---------+------+-----------+------------+-------------+  GSV at knee             yes    >500 ms      0.26    out of fascia  +--------------+---------+------+-----------+------------+-------------+  GSV prox calf           yes    >500 ms      0.26    out of fascia  +--------------+---------+------+-----------+------------+-------------+  GSV mid calf            yes    >500 ms      0.19    in fascia      +--------------+---------+------+-----------+------------+-------------+  GSV dist calf no                            0.20                   +--------------+---------+------+-----------+------------+-------------+  SSV Pop Fossa           yes    >500 ms      0.22    distal thigh   +--------------+---------+------+-----------+------------+-------------+  SSV prox calf           yes    >500 ms      0.20                   +--------------+---------+------+-----------+------------+-------------+  SSV mid calf  no                            0.23                   +--------------+---------+------+-----------+------------+-------------+  AASV O        no                            0.41                   +--------------+---------+------+-----------+------------+-------------+  AASV P        no                            0.32                   +--------------+---------+------+-----------+------------+-------------+  AASV M        no                            0.21                   +--------------+---------+------+-----------+------------+-------------+      ASSESSMENT/PLAN: 67 y.o. female presenting with right lower extremity chronic venous insufficiency.  She does have some localized areas of skin changes with hemosiderin deposit in the dermis.  Overall, the right leg is soft, there is no significant pitting edema except at the level of the ankle.  Some telangiectasias, no varicose veins.  Right lower extremity venous duplex ultrasound was reviewed demonstrating some superficial venous reflux in the greater saphenous vein from the knee into the calf.  The short saphenous vein is also involved.  Vessels are very small, measuring roughly 2 mm.  In the greater saphenous vein, the vein is out of the fascia.  I had a Meaney conversation with Cattleya regarding the above.  While she does have chronic venous insufficiency, she does not have vein amenable to laser ablation.  She does not have any varicosities that require stab phlebectomy.   She was understandably upset with this news, she states that her right leg especially is heavy and has pain associated by days end.  We discussed the importance of medical grade compression, elevation when able.  My plan is to see her back in 3 months.  In an effort to provide her a second opinion, I have set her up with my partner Dr. Scot Dock.  She was measured for compression stockings in clinic today. I have also sent referral to [facility] for metatarsal bursa pain.   I asked her to call my office should any questions or concerns arise.    Broadus John, MD [facility] [phone]

## 2022-09-10 ENCOUNTER — Telehealth: Payer: Self-pay

## 2022-09-10 NOTE — Telephone Encounter (Signed)
Pt called stating that this office was sending a referral to a podiatrist and she has still not heard from anyone.  Urgent referral in system. Called [facility], but office is closed.  Called pt, no answer, lf vm informing her of referral and it will be investigated on Monday when their office opens again.

## 2022-09-20 ENCOUNTER — Ambulatory Visit: Payer: Medicare Other | Attending: Cardiology | Admitting: Cardiology

## 2022-09-20 ENCOUNTER — Encounter: Payer: Self-pay | Admitting: Cardiology

## 2022-09-20 VITALS — BP 134/86 | HR 74 | Ht 67.0 in | Wt 139.0 lb

## 2022-09-20 DIAGNOSIS — R079 Chest pain, unspecified: Secondary | ICD-10-CM | POA: Diagnosis not present

## 2022-09-20 DIAGNOSIS — I872 Venous insufficiency (chronic) (peripheral): Secondary | ICD-10-CM | POA: Diagnosis not present

## 2022-09-20 DIAGNOSIS — Z01812 Encounter for preprocedural laboratory examination: Secondary | ICD-10-CM | POA: Diagnosis not present

## 2022-09-20 MED ORDER — METOPROLOL TARTRATE 100 MG PO TABS: ORAL_TABLET | ORAL | 0 refills | Status: DC

## 2022-09-20 MED ORDER — NITROGLYCERIN 0.4 MG SL SUBL: 0.4000 mg | SUBLINGUAL_TABLET | SUBLINGUAL | 3 refills | Status: AC | PRN

## 2022-09-20 NOTE — Patient Instructions (Addendum)
Medication Instructions:  Your physician has recommended you make the following change in your medication:  START: Nitroglycerin 0.4 mg place 1 tablet under your tongue every 5 minutes (up to three times) as needed for chest pain.  *If you need a refill on your cardiac medications before your next appointment, please call your pharmacy*   Lab Work: Your physician recommends that you have labs drawn: BMET, [place] If you have labs (blood work) drawn today and your tests are completely normal, you will receive your results only by: MyChart Message (if you have MyChart) OR A paper copy in the mail If you have any lab test that is abnormal or we need to change your treatment, we will call you to review the results.   Testing/Procedures:   Your cardiac CT will be scheduled at one of the below locations:   [facility] [address] [place], [place] [address] [phone]   If scheduled at [facility], please arrive at the [facility] and [facility]'s Entrance (Entrance C2) of [facility] 30 minutes prior to test start time. You can use the FREE valet parking offered at entrance C (encouraged to control the heart rate for the test)  Proceed to the [facility] Radiology Department (first floor) to check-in and test prep.  All radiology patients and guests should use entrance C2 at [facility], accessed from [facility], even though the hospital's physical address listed is [address].       Please follow these instructions carefully (unless otherwise directed):   On the Night Before the Test: Be sure to Drink plenty of water. Do not consume any caffeinated/decaffeinated beverages or chocolate 12 hours prior to your test. Do not take any antihistamines 12 hours prior to your test.  On the Day of the Test: Drink plenty of water until 1 hour prior to the test. Do not eat any food 1 hour prior to test. You may take your regular  medications prior to the test.  Take metoprolol (Lopressor) two hours prior to test. Please HOLD Metoprolol tartrate (Toprol-XL) on the morning of the test. FEMALES- please wear underwire-free bra if available, avoid dresses & tight clothing      After the Test: Drink plenty of water. After receiving IV contrast, you may experience a mild flushed feeling. This is normal. On occasion, you may experience a mild rash up to 24 hours after the test. This is not dangerous. If this occurs, you can take Benadryl 25 mg and increase your fluid intake. If you experience trouble breathing, this can be serious. If it is severe call 911 IMMEDIATELY. If it is mild, please call our office. If you take any of these medications: Glipizide/Metformin, Avandament, Glucavance, please do not take 48 hours after completing test unless otherwise instructed.  We will call to schedule your test 2-4 weeks out understanding that some insurance companies will need an authorization prior to the service being performed.   For non-scheduling related questions, please contact the cardiac imaging nurse navigator should you have any questions/concerns: Marchia Bond, Cardiac Imaging Nurse Navigator Gordy Clement, Cardiac Imaging Nurse Navigator [facility] Direct Office Dial: [phone]   For scheduling needs, including cancellations and rescheduling, please call [place], [phone].    Follow-Up: At [facility], you and your health needs are our priority.  As part of our continuing mission to provide you with exceptional heart care, we have created designated Provider Care Teams.  These Care Teams include your primary Cardiologist (physician) and Advanced Practice Providers (APPs -  Physician Assistants and Nurse Practitioners) who all work together to provide you with the care you need, when you need it.  Your next appointment:   1 year(s)  Provider:   Berniece Salines, DO

## 2022-09-20 NOTE — Progress Notes (Unsigned)
Cardiology Office Note:    Date:  09/22/2022   ID:  Misty Owens, DOB 1956-05-18, MRN [id number]  PCP:  Pcp, No  Cardiologist:  Berniece Salines, DO  Electrophysiologist:  None   Referring MD: No ref. provider found   " I am palpitation"  History of Present Illness:    Misty Owens is a 67 y.o. female with a hx of hepatitis C infection, asthma, chronic vaginal insufficiency her today for a follow up visit.   At her last visit she complained of palpitations and leg swelling. I ordered a monitor, echo and lowe extremity doppler.   At her last visit we discussed her monitor results - recommended her Toprol XL 12.5 mg daily she reluctantly accepted. I also referred the patient to vascular for the chronic venous insufficiency.  Since her visit with me she tells me she has been experiencing intermittent chest discomfort.  She described as a left-sided pain which comes and goes.  Describes an achy pressure-like sensation.  Nothing makes it better or worse.     Past Medical History:  Diagnosis Date   Abnormal Pap smear    1967, cryotherapy   Anemia    Asthma    Bartholin cyst    Bartholin's Abscess   Fibroid    Gonorrhea    H/O varicella    Headache(784.0)    Hepatitis C    Hepatitis C infection    Herpes simplex without mention of complication    History of blood transfusion    History of measles, mumps, or rubella    History of varicose veins    Hx of migraine headaches    Marijuana use    Migraine    Postpartum depression    Wisdom teeth extracted     Past Surgical History:  Procedure Laterality Date   CLAVICLE EXCISION     CYSTECTOMY  1976   2 OVARIAN CYST 3 YEARS APART   DILATION AND CURETTAGE OF UTERUS  2010   menorrhagia & endometrial polyp   WISDOM TOOTH EXTRACTION      Current Medications: Current Meds  Medication Sig   AMBULATORY NON FORMULARY MEDICATION Place 0.75 mg onto the skin every morning. Medication Name: Testosterone 0.75 mg/1/2 mL apply between  thighs daily   AMBULATORY NON FORMULARY MEDICATION Take 5 mg by mouth daily. Medication Name: DHEA  5 mg 1 po daily   Ashwagandha Extract 2.5 % POWD by Does not apply route.   b complex vitamins tablet Take 1 tablet by mouth daily.   Coenzyme Q10 (CO Q 10 PO) Take by mouth.   Cyanocobalamin (VITAMIN B 12 PO) Take by mouth.   estradiol (ESTRACE) 1 MG tablet 1 tablet Orally Once a day for 30 day(s)   fish oil-omega-3 fatty acids 1000 MG capsule Take 2 g by mouth daily.   magnesium 30 MG tablet Take 30 mg by mouth 2 (two) times daily.   metoprolol succinate (TOPROL XL) 25 MG 24 hr tablet Take 0.5 tablets (12.5 mg total) by mouth daily.   metoprolol tartrate (LOPRESSOR) 100 MG tablet Take 2 hours prior to CT   MILK THISTLE PO Take by mouth.   Multiple Vitamin (MULTIVITAMIN) tablet Take 1 tablet by mouth daily.   nitroGLYCERIN (NITROSTAT) 0.4 MG SL tablet Place 1 tablet (0.4 mg total) under the tongue every 5 (five) minutes as needed for chest pain.   NON FORMULARY PT TAKE PROGESTERONE CREAM 100 MG BID,TESTOSTERONE .5MG , DHEA 10 4 X A WEEK, AND BI-ESTROGEN 2.2MG 1X  EVERY OTHER DAY.   Polyethyl Glyc-Propyl Glyc PF (SYSTANE PRESERVATIVE FREE) 0.4-0.3 % SOLN    UNABLE TO FIND PT TAKE FOOD ENZYMES   valACYclovir (VALTREX) 500 MG tablet Take 500 mg by mouth 2 (two) times daily.     Allergies:   Penicillins, Librium [chlordiazepoxide hcl], and Tetracyclines & related   Social History   Socioeconomic History   Marital status: Single    Spouse name: Not on file   Number of children: Not on file   Years of education: Not on file   Highest education level: Not on file  Occupational History   Not on file  Tobacco Use   Smoking status: Never   Smokeless tobacco: Never  Substance and Sexual Activity   Alcohol use: Yes    Comment: occasional   Drug use: Yes   Sexual activity: Yes    Birth control/protection: Post-menopausal  Other Topics Concern   Not on file  Social History Narrative   Not  on file   Social Determinants of Health   Financial Resource Strain: Not on file  Food Insecurity: Not on file  Transportation Needs: Not on file  Physical Activity: Not on file  Stress: Not on file  Social Connections: Not on file     Family History: The patient's family history includes Anemia in her mother; Arthritis in her father; Cancer in her father, maternal grandmother, mother, and paternal grandmother; Deep vein thrombosis in her father; Diabetes in her father; Heart disease in her father; Hypertension in her father; Migraines in her mother and paternal grandfather; Stroke in her paternal grandmother.  ROS:   Review of Systems  Constitution: Negative for decreased appetite, fever and weight gain.  HENT: Negative for congestion, ear discharge, hoarse voice and sore throat.   Eyes: Negative for discharge, redness, vision loss in right eye and visual halos.  Cardiovascular: Reports palpitation and chest pain.  Negative for dyspnea on exertion, leg swelling, orthopnea and palpitations.  Respiratory: Negative for cough, hemoptysis, shortness of breath and snoring.   Endocrine: Negative for heat intolerance and polyphagia.  Hematologic/Lymphatic: Negative for bleeding problem. Does not bruise/bleed easily.  Skin: Negative for flushing, nail changes, rash and suspicious lesions.  Musculoskeletal: Negative for arthritis, joint pain, muscle cramps, myalgias, neck pain and stiffness.  Gastrointestinal: Negative for abdominal pain, bowel incontinence, diarrhea and excessive appetite.  Genitourinary: Negative for decreased libido, genital sores and incomplete emptying.  Neurological: Negative for brief paralysis, focal weakness, headaches and loss of balance.  Psychiatric/Behavioral: Negative for altered mental status, depression and suicidal ideas.  Allergic/Immunologic: Negative for HIV exposure and persistent infections.    EKGs/Labs/Other Studies Reviewed:    The following studies  were reviewed today:   EKG:  none today  06/22/2022 Patch Wear Time:  13 days and 10 hours (2023-11-30T23:06:59-0500 to 2023-12-14T09:48:42-0500) Patient had a min HR of 60 bpm, max HR of 200 bpm, and avg HR of 83 bpm. Predominant underlying rhythm was Sinus Rhythm. 10 Supraventricular Tachycardia runs occurred, the run with the fastest interval lasting 13 beats with a max rate of 200 bpm, the  longest lasting 16.1 secs with an avg rate of 113 bpm. Isolated SVEs were rare (<1.0%), SVE Couplets were rare (<1.0%), and SVE Triplets were rare (<1.0%). Isolated VEs were rare (<1.0%), VE Couplets were rare (<1.0%), and no VE Triplets were present.  Symptoms was associated sinus rhythm.    Conclusion: The study showed the following:  1. Asymptomatic paroxysmal Supraventricular tachycardia  Lower extremity doppler 06/2022 Comparison Study: None   Performing Technologist: Salvadore Dom RVT  Examination Guidelines: A complete evaluation includes at minimum, Doppler  waveform signals and systolic blood pressure reading at the level of  bilateral  brachial, anterior tibial, and posterior tibial arteries, when vessel  segments  are accessible. Bilateral testing is considered an integral part of a  complete  examination. Photoelectric Plethysmograph (PPG) waveforms and toe systolic  pressure readings are included as required and additional duplex testing  as  needed. Limited examinations for reoccurring indications may be performed  as  noted.     ABI Findings:  +---------+------------------+-----+---------+--------+  Right   Rt Pressure (mmHg)IndexWaveform Comment   +---------+------------------+-----+---------+--------+  Brachial 125                                       +---------+------------------+-----+---------+--------+  CFA                            triphasic          +---------+------------------+-----+---------+--------+  Popliteal                       biphasic           +---------+------------------+-----+---------+--------+  PTA     150               1.03 triphasic          +---------+------------------+-----+---------+--------+  PERO    137               0.94 triphasic          +---------+------------------+-----+---------+--------+  DP      138               0.95 biphasic           +---------+------------------+-----+---------+--------+  Cleotis Nipper               0.87 Normal             +---------+------------------+-----+---------+--------+   +---------+------------------+-----+---------+-------+  Left    Lt Pressure (mmHg)IndexWaveform Comment  +---------+------------------+-----+---------+-------+  Brachial 146                                      +---------+------------------+-----+---------+-------+  CFA                            triphasic         +---------+------------------+-----+---------+-------+  Popliteal                      biphasic          +---------+------------------+-----+---------+-------+  PTA     152               1.04 triphasic         +---------+------------------+-----+---------+-------+  PERO    154               1.23 triphasic         +---------+------------------+-----+---------+-------+  DP      159               1.09 triphasic         +---------+------------------+-----+---------+-------+  Great Toe108               0.74 Normal            +---------+------------------+-----+---------+-------+   +-------+-----------+-----------+------------+------------+  ABI/TBIToday's ABIToday's TBIPrevious ABIPrevious TBI  +-------+-----------+-----------+------------+------------+  Right 1.03       .87                                  +-------+-----------+-----------+------------+------------+  Left  1.09       .74                                  +-------+-----------+-----------+------------+------------+            Summary:  Right: Resting right ankle-brachial index is within normal range. The  right toe-brachial index is normal.   Left: Resting left ankle-brachial index is within normal range. The left  toe-brachial index is normal.    *See table(s) above for measurements and observations.      TTE 07/21/2022 IMPRESSIONS   1. Left ventricular ejection fraction, by estimation, is 60 to 65%. The  left ventricle has normal function. The left ventricle has no regional  wall motion abnormalities. Left ventricular diastolic parameters were  normal. The average left ventricular  global longitudinal strain is -20.1 %. The global longitudinal strain is  normal.   2. Right ventricular systolic function is normal. The right ventricular  size is normal. There is normal pulmonary artery systolic pressure.   3. The mitral valve is normal in structure. Trivial mitral valve  regurgitation. No evidence of mitral stenosis.   4. The aortic valve is grossly normal. There is mild calcification of the  aortic valve. Aortic valve regurgitation is not visualized. Aortic valve  sclerosis is present, with no evidence of aortic valve stenosis.   5. The inferior vena cava is normal in size with <50% respiratory  variability, suggesting right atrial pressure of 8 mmHg.   Comparison(s): No prior Echocardiogram.   Conclusion(s)/Recommendation(s): Normal biventricular function without  evidence of hemodynamically significant valvular heart disease.    Recent Labs: 09/20/2022: BUN 25; Creatinine, Ser 0.64; Magnesium 2.4; Potassium 4.5; Sodium 141  Recent Lipid Panel No results found for: "CHOL", "TRIG", "HDL", "CHOLHDL", "VLDL", "LDLCALC", "LDLDIRECT"  Physical Exam:    VS:  BP 134/86   Pulse 74   Ht 5\' 7"  (1.702 m)   Wt 63 kg   SpO2 98%   BMI 21.77 kg/m     Wt Readings from Last 3 Encounters:  09/20/22 63 kg  08/23/22 62.6 kg  08/03/22 63 kg     GEN: Well nourished, well developed in no  acute distress HEENT: Normal NECK: No JVD; No carotid bruits LYMPHATICS: No lymphadenopathy CARDIAC: S1S2 noted,RRR, no murmurs, rubs, gallops RESPIRATORY:  Clear to auscultation without rales, wheezing or rhonchi  ABDOMEN: Soft, non-tender you well, is here you to, non-distended, +bowel sounds, no guarding. EXTREMITIES: Bilateral edema, No cyanosis, no clubbing, +1 pulses bilateral foot MUSCULOSKELETAL:  No deformity  SKIN: Warm and dry NEUROLOGIC:  Alert and oriented x 3, non-focal PSYCHIATRIC:  Normal affect, good insight  ASSESSMENT:    1. Chest pain of uncertain etiology   2. Pre-procedure lab exam   3. Venous insufficiency of both lower extremities     PLAN:    The symptoms chest pain is concerning, this patient does have intermediate risk  for coronary artery disease and at this time I would like to pursue an ischemic evaluation in this patient.  Shared decision a coronary CTA at this time is appropriate.  I have discussed with the patient about the testing.  The patient has no IV contrast allergy and is agreeable to proceed with this test.  Sublingual nitroglycerin prescription was sent, its protocol and 911 protocol explained and the patient vocalized understanding questions were answered to the patient's satisfaction   Will continue with Toprol-XL, 5 mg twice daily.   Medication Adjustments/Labs and Tests Ordered: Current medicines are reviewed at length with the patient today.  Concerns regarding medicines are outlined above.  Orders Placed This Encounter  Procedures   Basic Metabolic Panel (BMET)   Magnesium   Meds ordered this encounter  Medications   metoprolol tartrate (LOPRESSOR) 100 MG tablet    Sig: Take 2 hours prior to CT    Dispense:  1 tablet    Refill:  0   nitroGLYCERIN (NITROSTAT) 0.4 MG SL tablet    Sig: Place 1 tablet (0.4 mg total) under the tongue every 5 (five) minutes as needed for chest pain.    Dispense:  90 tablet    Refill:  3     Patient Instructions  Medication Instructions:  Your physician has recommended you make the following change in your medication:  START: Nitroglycerin 0.4 mg place 1 tablet under your tongue every 5 minutes (up to three times) as needed for chest pain.  *If you need a refill on your cardiac medications before your next appointment, please call your pharmacy*   Lab Work: Your physician recommends that you have labs drawn: BMET, [place] If you have labs (blood work) drawn today and your tests are completely normal, you will receive your results only by: MyChart Message (if you have MyChart) OR A paper copy in the mail If you have any lab test that is abnormal or we need to change your treatment, we will call you to review the results.   Testing/Procedures:   Your cardiac CT will be scheduled at one of the below locations:   [facility] [address] [place], [place] [address] [phone]   If scheduled at [facility], please arrive at the [facility] and Children's Entrance (Entrance C2) of [facility] 30 minutes prior to test start time. You can use the FREE valet parking offered at entrance C (encouraged to control the heart rate for the test)  Proceed to the [facility] Radiology Department (first floor) to check-in and test prep.  All radiology patients and guests should use entrance C2 at [facility], accessed from [facility] even though the hospital's physical address listed is [address]       Please follow these instructions carefully (unless otherwise directed):   On the Night Before the Test: Be sure to Drink plenty of water. Do not consume any caffeinated/decaffeinated beverages or chocolate 12 hours prior to your test. Do not take any antihistamines 12 hours prior to your test.  On the Day of the Test: Drink plenty of water until 1 hour prior to the test. Do not eat any food 1 hour prior to  test. You may take your regular medications prior to the test.  Take metoprolol (Lopressor) two hours prior to test. Please HOLD Metoprolol tartrate (Toprol-XL) on the morning of the test. FEMALES- please wear underwire-free bra if available, avoid dresses & tight clothing  After the Test: Drink plenty of water. After receiving IV contrast, you may experience a mild flushed feeling. This is normal. On occasion, you may experience a mild rash up to 24 hours after the test. This is not dangerous. If this occurs, you can take Benadryl 25 mg and increase your fluid intake. If you experience trouble breathing, this can be serious. If it is severe call 911 IMMEDIATELY. If it is mild, please call our office. If you take any of these medications: Glipizide/Metformin, Avandament, Glucavance, please do not take 48 hours after completing test unless otherwise instructed.  We will call to schedule your test 2-4 weeks out understanding that some insurance companies will need an authorization prior to the service being performed.   For non-scheduling related questions, please contact the cardiac imaging nurse navigator should you have any questions/concerns: Marchia Bond, Cardiac Imaging Nurse Navigator Gordy Clement, Cardiac Imaging Nurse Navigator [facility] Direct Office Dial: [phone]   For scheduling needs, including cancellations and rescheduling, please call [place], [phone].    Follow-Up: At [facility], you and your health needs are our priority.  As part of our continuing mission to provide you with exceptional heart care, we have created designated Provider Care Teams.  These Care Teams include your primary Cardiologist (physician) and Advanced Practice Providers (APPs -  Physician Assistants and Nurse Practitioners) who all work together to provide you with the care you need, when you need it.  Your next appointment:   1  year(s)  Provider:   Berniece Salines, DO       Adopting a Healthy Lifestyle.  Know what a healthy weight is for you (roughly BMI <25) and aim to maintain this   Aim for 7+ servings of fruits and vegetables daily   65-80+ fluid ounces of water or unsweet tea for healthy kidneys   Limit to max 1 drink of alcohol per day; avoid smoking/tobacco   Limit animal fats in diet for cholesterol and heart health - choose grass fed whenever available   Avoid highly processed foods, and foods high in saturated/trans fats   Aim for low stress - take time to unwind and care for your mental health   Aim for 150 min of moderate intensity exercise weekly for heart health, and weights twice weekly for bone health   Aim for 7-9 hours of sleep daily   When it comes to diets, agreement about the perfect plan isnt easy to find, even among the experts. Experts at the [employer] developed an idea known as the Healthy Eating Plate. Just imagine a plate divided into logical, healthy portions.   The emphasis is on diet quality:   Load up on vegetables and fruits - one-half of your plate: Aim for color and variety, and remember that potatoes dont count.   Go for whole grains - one-quarter of your plate: Whole wheat, barley, wheat berries, quinoa, oats, brown rice, and foods made with them. If you want pasta, go with whole wheat pasta.   Protein power - one-quarter of your plate: Fish, chicken, beans, and nuts are all healthy, versatile protein sources. Limit red meat.   The diet, however, does go beyond the plate, offering a few other suggestions.   Use healthy plant oils, such as olive, canola, soy, corn, sunflower and peanut. Check the labels, and avoid partially hydrogenated oil, which have unhealthy trans fats.   If youre thirsty, drink water. Coffee and tea are good  in moderation, but skip sugary drinks and limit milk and dairy products to one or two daily servings.   The type  of carbohydrate in the diet is more important than the amount. Some sources of carbohydrates, such as vegetables, fruits, whole grains, and beans-are healthier than others.   Finally, stay active  Signed, Berniece Salines, DO  09/22/2022 8:21 AM    [place]

## 2022-09-21 LAB — BASIC METABOLIC PANEL
BUN/Creatinine Ratio: 39 — ABNORMAL HIGH (ref 12–28)
BUN: 25 mg/dL (ref 8–27)
CO2: 24 mmol/L (ref 20–29)
Calcium: 10.3 mg/dL (ref 8.7–10.3)
Chloride: 103 mmol/L (ref 96–106)
Creatinine, Ser: 0.64 mg/dL (ref 0.57–1.00)
Glucose: 96 mg/dL (ref 70–99)
Potassium: 4.5 mmol/L (ref 3.5–5.2)
Sodium: 141 mmol/L (ref 134–144)
eGFR: 97 mL/min/{1.73_m2} (ref >59)

## 2022-09-21 LAB — MAGNESIUM: Magnesium: 2.4 mg/dL — ABNORMAL HIGH (ref 1.6–2.3)

## 2022-10-05 ENCOUNTER — Ambulatory Visit (INDEPENDENT_AMBULATORY_CARE_PROVIDER_SITE_OTHER): Payer: Medicare Other

## 2022-10-05 ENCOUNTER — Encounter: Payer: Self-pay | Admitting: Podiatry

## 2022-10-05 ENCOUNTER — Ambulatory Visit (INDEPENDENT_AMBULATORY_CARE_PROVIDER_SITE_OTHER): Payer: Medicare Other | Admitting: Podiatry

## 2022-10-05 DIAGNOSIS — H35373 Puckering of macula, bilateral: Secondary | ICD-10-CM | POA: Insufficient documentation

## 2022-10-05 DIAGNOSIS — D259 Leiomyoma of uterus, unspecified: Secondary | ICD-10-CM | POA: Insufficient documentation

## 2022-10-05 DIAGNOSIS — G5763 Lesion of plantar nerve, bilateral lower limbs: Secondary | ICD-10-CM

## 2022-10-05 DIAGNOSIS — H04129 Dry eye syndrome of unspecified lacrimal gland: Secondary | ICD-10-CM | POA: Insufficient documentation

## 2022-10-05 DIAGNOSIS — M7742 Metatarsalgia, left foot: Secondary | ICD-10-CM | POA: Diagnosis not present

## 2022-10-05 DIAGNOSIS — M7741 Metatarsalgia, right foot: Secondary | ICD-10-CM

## 2022-10-05 DIAGNOSIS — H35372 Puckering of macula, left eye: Secondary | ICD-10-CM | POA: Insufficient documentation

## 2022-10-05 DIAGNOSIS — Z961 Presence of intraocular lens: Secondary | ICD-10-CM | POA: Insufficient documentation

## 2022-10-05 DIAGNOSIS — M722 Plantar fascial fibromatosis: Secondary | ICD-10-CM | POA: Diagnosis not present

## 2022-10-05 DIAGNOSIS — L719 Rosacea, unspecified: Secondary | ICD-10-CM | POA: Insufficient documentation

## 2022-10-05 DIAGNOSIS — H5315 Visual distortions of shape and size: Secondary | ICD-10-CM | POA: Insufficient documentation

## 2022-10-05 DIAGNOSIS — B182 Chronic viral hepatitis C: Secondary | ICD-10-CM | POA: Insufficient documentation

## 2022-10-05 DIAGNOSIS — H04123 Dry eye syndrome of bilateral lacrimal glands: Secondary | ICD-10-CM | POA: Insufficient documentation

## 2022-10-05 DIAGNOSIS — H43819 Vitreous degeneration, unspecified eye: Secondary | ICD-10-CM | POA: Insufficient documentation

## 2022-10-05 DIAGNOSIS — H43813 Vitreous degeneration, bilateral: Secondary | ICD-10-CM | POA: Insufficient documentation

## 2022-10-05 DIAGNOSIS — H2510 Age-related nuclear cataract, unspecified eye: Secondary | ICD-10-CM | POA: Insufficient documentation

## 2022-10-05 DIAGNOSIS — N951 Menopausal and female climacteric states: Secondary | ICD-10-CM | POA: Insufficient documentation

## 2022-10-05 NOTE — Patient Instructions (Signed)
Call Phone: [phone] about the ultrasound if you do not hear from them   Plantar Fasciitis (Heel Spur Syndrome) with Rehab The plantar fascia is a fibrous, ligament-like, soft-tissue structure that spans the bottom of the foot. Plantar fasciitis is a condition that causes pain in the foot due to inflammation of the tissue. SYMPTOMS  Pain and tenderness on the underneath side of the foot. Pain that worsens with standing or walking. CAUSES  Plantar fasciitis is caused by irritation and injury to the plantar fascia on the underneath side of the foot. Common mechanisms of injury include: Direct trauma to bottom of the foot. Damage to a small nerve that runs under the foot where the main fascia attaches to the heel bone. Stress placed on the plantar fascia due to bone spurs. RISK INCREASES WITH:  Activities that place stress on the plantar fascia (running, jumping, pivoting, or cutting). Poor strength and flexibility. Improperly fitted shoes. Tight calf muscles. Flat feet. Failure to warm-up properly before activity. Obesity. PREVENTION Warm up and stretch properly before activity. Allow for adequate recovery between workouts. Maintain physical fitness: Strength, flexibility, and endurance. Cardiovascular fitness. Maintain a health body weight. Avoid stress on the plantar fascia. Wear properly fitted shoes, including arch supports for individuals who have flat feet.  PROGNOSIS  If treated properly, then the symptoms of plantar fasciitis usually resolve without surgery. However, occasionally surgery is necessary.  RELATED COMPLICATIONS  Recurrent symptoms that may result in a chronic condition. Problems of the lower back that are caused by compensating for the injury, such as limping. Pain or weakness of the foot during push-off following surgery. Chronic inflammation, scarring, and partial or complete fascia tear, occurring more often from repeated injections.  TREATMENT   Treatment initially involves the use of ice and medication to help reduce pain and inflammation. The use of strengthening and stretching exercises may help reduce pain with activity, especially stretches of the Achilles tendon. These exercises may be performed at home or with a therapist. Your caregiver may recommend that you use heel cups of arch supports to help reduce stress on the plantar fascia. Occasionally, corticosteroid injections are given to reduce inflammation. If symptoms persist for greater than 6 months despite non-surgical (conservative), then surgery may be recommended.   MEDICATION  If pain medication is necessary, then nonsteroidal anti-inflammatory medications, such as aspirin and ibuprofen, or other minor pain relievers, such as acetaminophen, are often recommended. Do not take pain medication within 7 days before surgery. Prescription pain relievers may be given if deemed necessary by your caregiver. Use only as directed and only as much as you need. Corticosteroid injections may be given by your caregiver. These injections should be reserved for the most serious cases, because they may only be given a certain number of times.  HEAT AND COLD Cold treatment (icing) relieves pain and reduces inflammation. Cold treatment should be applied for 10 to 15 minutes every 2 to 3 hours for inflammation and pain and immediately after any activity that aggravates your symptoms. Use ice packs or massage the area with a piece of ice (ice massage). Heat treatment may be used prior to performing the stretching and strengthening activities prescribed by your caregiver, physical therapist, or athletic trainer. Use a heat pack or soak the injury in warm water.  SEEK IMMEDIATE MEDICAL CARE IF: Treatment seems to offer no benefit, or the condition worsens. Any medications produce adverse side effects.  EXERCISES- RANGE OF MOTION (ROM) AND STRETCHING EXERCISES - Plantar Fasciitis (  Heel Spur  Syndrome) These exercises may help you when beginning to rehabilitate your injury. Your symptoms may resolve with or without further involvement from your physician, physical therapist or athletic trainer. While completing these exercises, remember:  Restoring tissue flexibility helps normal motion to return to the joints. This allows healthier, less painful movement and activity. An effective stretch should be held for at least 30 seconds. A stretch should never be painful. You should only feel a gentle lengthening or release in the stretched tissue.  RANGE OF MOTION - Toe Extension, Flexion Sit with your right / left leg crossed over your opposite knee. Grasp your toes and gently pull them back toward the top of your foot. You should feel a stretch on the bottom of your toes and/or foot. Hold this stretch for 10 seconds. Now, gently pull your toes toward the bottom of your foot. You should feel a stretch on the top of your toes and or foot. Hold this stretch for 10 seconds. Repeat  times. Complete this stretch 3 times per day.   RANGE OF MOTION - Ankle Dorsiflexion, Active Assisted Remove shoes and sit on a chair that is preferably not on a carpeted surface. Place right / left foot under knee. Extend your opposite leg for support. Keeping your heel down, slide your right / left foot back toward the chair until you feel a stretch at your ankle or calf. If you do not feel a stretch, slide your bottom forward to the edge of the chair, while still keeping your heel down. Hold this stretch for 10 seconds. Repeat 3 times. Complete this stretch 2 times per day.   STRETCH  Gastroc, Standing Place hands on wall. Extend right / left leg, keeping the front knee somewhat bent. Slightly point your toes inward on your back foot. Keeping your right / left heel on the floor and your knee straight, shift your weight toward the wall, not allowing your back to arch. You should feel a gentle stretch in the  right / left calf. Hold this position for 10 seconds. Repeat 3 times. Complete this stretch 2 times per day.  STRETCH  Soleus, Standing Place hands on wall. Extend right / left leg, keeping the other knee somewhat bent. Slightly point your toes inward on your back foot. Keep your right / left heel on the floor, bend your back knee, and slightly shift your weight over the back leg so that you feel a gentle stretch deep in your back calf. Hold this position for 10 seconds. Repeat 3 times. Complete this stretch 2 times per day.  STRETCH  Gastrocsoleus, Standing  Note: This exercise can place a lot of stress on your foot and ankle. Please complete this exercise only if specifically instructed by your caregiver.  Place the ball of your right / left foot on a step, keeping your other foot firmly on the same step. Hold on to the wall or a rail for balance. Slowly lift your other foot, allowing your body weight to press your heel down over the edge of the step. You should feel a stretch in your right / left calf. Hold this position for 10 seconds. Repeat this exercise with a slight bend in your right / left knee. Repeat 3 times. Complete this stretch 2 times per day.   STRENGTHENING EXERCISES - Plantar Fasciitis (Heel Spur Syndrome)  These exercises may help you when beginning to rehabilitate your injury. They may resolve your symptoms with or without further involvement  from your physician, physical therapist or athletic trainer. While completing these exercises, remember:  Muscles can gain both the endurance and the strength needed for everyday activities through controlled exercises. Complete these exercises as instructed by your physician, physical therapist or athletic trainer. Progress the resistance and repetitions only as guided.  STRENGTH - Towel Curls Sit in a chair positioned on a non-carpeted surface. Place your foot on a towel, keeping your heel on the floor. Pull the towel toward  your heel by only curling your toes. Keep your heel on the floor. Repeat 3 times. Complete this exercise 2 times per day.  STRENGTH - Ankle Inversion Secure one end of a rubber exercise band/tubing to a fixed object (table, pole). Loop the other end around your foot just before your toes. Place your fists between your knees. This will focus your strengthening at your ankle. Slowly, pull your big toe up and in, making sure the band/tubing is positioned to resist the entire motion. Hold this position for 10 seconds. Have your muscles resist the band/tubing as it slowly pulls your foot back to the starting position. Repeat 3 times. Complete this exercises 2 times per day.  Document Released: 06/21/2005 Document Revised: 09/13/2011 Document Reviewed: 10/03/2008 [facility] Patient Information 2014 [place], [place].

## 2022-10-10 NOTE — Progress Notes (Signed)
  Subjective:  Patient ID: Misty Owens, female    DOB: 18-Jul-1955,  MRN: [id number]  Chief Complaint  Patient presents with   Foot Pain    Plantar forefoot bilateral and plantar heel right - aching x few months, AM pain, "feels like they are filled with fluid when I step down in the mornings", concerned about swelling in ankles/legs, purple/dark discolorations, been to cardiology-had vascular studies   New Patient (Initial Visit)    67 y.o. female presents with the above complaint. History confirmed with patient.   Objective:  Physical Exam: warm, good capillary refill, no trophic changes or ulcerative lesions, normal DP and PT pulses, normal sensory exam, and pain to palpation to second and third interspace bilateral, minimal radiation into toes, no palpable Mulder's click, she has arch pain through the arch on the right with palpation to the plantar fascia   Radiographs: Multiple views x-ray of both feet: no fracture, dislocation, swelling or degenerative changes noted Assessment:   1. Metatarsalgia of left foot   2. Metatarsalgia of right foot   3. Morton's neuroma of both feet   4. Plantar fasciitis of right foot      Plan:  Patient was evaluated and treated and all questions answered.  Discussed the etiology and treatment options for plantar fasciitis including stretching, formal physical therapy, supportive shoegears such as a running shoe or sneaker, pre fabricated orthoses, injection therapy, and oral medications. We also discussed the role of surgical treatment of this for patients who do not improve after exhausting non-surgical treatment options.   -XR reviewed with patient -Educated patient on stretching and icing of the affected limb -Discussed the option of injection for the plantar fasciitis.  She would like to hold off on this.  Plan for this next visit if not improving  He states that etiology of Morton's neuroma and metatarsalgia.  She does have some evidence of  neuroma symptoms.  I recommended imaging because of the difficult to localize her symptoms to the second or third interspace prior to injection.  Ultrasound ordered from [facility].  Return for follow-up after this.  Return for after ultrasound to review.

## 2022-10-15 NOTE — Progress Notes (Signed)
Information was faxed over today 10/15/22

## 2022-11-24 ENCOUNTER — Ambulatory Visit: Payer: No Typology Code available for payment source | Admitting: Vascular Surgery

## 2023-07-19 ENCOUNTER — Other Ambulatory Visit: Payer: Self-pay | Admitting: Cardiology

## 2023-10-12 NOTE — Progress Notes (Signed)
 Cardiology Office Note:    Date:  10/20/2023   ID:  Misty Owens, DOB [birth year], MRN [id number]  PCP:  Darnelle Elders, NP  Cardiologist:  Kardie Tobb, DO     Referring MD: No ref. provider found   Chief Complaint: follow-up of chest pain and palpitations   History of Present Illness:    Misty Owens is a 68 y.o. female with a history of intermittent chest pain, palpitations with short runs of SVT noted on monitor in 06/2022, chronic venous insufficiency, asthma, and hepatitis C who is followed by Dr. Emmette Harms and presents today for routine follow-up.  Patient was referred to Dr. Emmette Harms in 05/2022 for further evaluation of intermittent palpitations with associated chest discomfort and dizziness at times. She also reported some leg pain with exertion at that visit. 2 week Zio monitor, Echo, and ABIs were ordered for further evaluation. Monitor showed 10 runs of SVT with longest run lasting 16.1 seconds with average heart rate of 113 bpm and fastest run lasting 13 beats with a max rate of 200 bpm. However, triggered events corresponded with sinus rhythm. She was started on Toprol-XL. Echo showed LVEF of 60-65% with normal wall motion, normal RV function, and no significant valvular disease. ABIs/ TBIs were normal bilaterally. She was subsequently referred to Vascular Surgery for chronic venous insufficiency. Right venous doppler in 08/2022 showed no evidence of DVT but did show venous reflux in the right common femoral veins, right greater saphenous vein at medial knee, and right short saphenous vein. She was not felt to have veins amenable to laser ablation. Compression stockings were recommended.   Patient was last seen by Dr. Emmette Harms in 09/2022 at which time she reported intermittent chest discomfort that she described as a left-sided pressure-like pain that comes and goes. Coronary CTA was ordered for further evaluation but it does not look like this was ever done.  She presents today for  follow-up. She continues to have episodes of chest discomfort. She describes this as a sharp pain that occurs randomly at rest. She exercises several days a week and denies any pain with this. She states sometimes the pain will go away if she pounds on her chest. She also reports more palpitations that she describes as a "fluttering" sensation over the last 2 months. She states these will las 1-2 minutes and then go away. They are more noticeable at night. She denies any increase in caffeine intake. She does report feeling being stress/ anxious but states she is always stressed/ anxious. This has not increased over the last 2 months. She has had some shortness of breath recently due to an upper respiratory infection but this is getting better. No orthopnea or PND. She has some chronic lower extremity edema due to chronic venous insufficiency but states this has improved since she has increased her activity. She has also been using PEMF (pulsed electromagnetic field therapy) with her chiropractor which has helped her chronic pain issues.   She is worried about her chest pain because her father had multiple heart attacks (first one was in his early 60s) and ultimately required a CABG.   EKGs/Labs/Other Studies Reviewed:    The following studies were reviewed:  Monitor 06/03/2022 to 06/17/2022: Patient had a min HR of 60 bpm, max HR of 200 bpm, and avg HR of 83 bpm. Predominant underlying rhythm was Sinus Rhythm. 10 Supraventricular Tachycardia runs occurred, the run with the fastest interval lasting 13 beats with a max rate of 200 bpm,  the  longest lasting 16.1 secs with an avg rate of 113 bpm. Isolated SVEs were rare (<1.0%), SVE Couplets were rare (<1.0%), and SVE Triplets were rare (<1.0%). Isolated VEs were rare (<1.0%), VE Couplets were rare (<1.0%), and no VE Triplets were present.    Symptoms was associated sinus rhythm.    Conclusion: The study showed the following:            1. Asymptomatic  paroxysmal Supraventricular tachycardia.  _______________  ABIs/ TBIs 06/30/2022: Summary:  Right: Resting right ankle-brachial index is within normal range. The  right toe-brachial index is normal.   Left: Resting left ankle-brachial index is within normal range. The left  toe-brachial index is normal.  _______________  Echocardiogram 07/21/2022: Impressions: 1. Left ventricular ejection fraction, by estimation, is 60 to 65%. The  left ventricle has normal function. The left ventricle has no regional  wall motion abnormalities. Left ventricular diastolic parameters were  normal. The average left ventricular  global longitudinal strain is -20.1 %. The global longitudinal strain is  normal.   2. Right ventricular systolic function is normal. The right ventricular  size is normal. There is normal pulmonary artery systolic pressure.   3. The mitral valve is normal in structure. Trivial mitral valve  regurgitation. No evidence of mitral stenosis.   4. The aortic valve is grossly normal. There is mild calcification of the  aortic valve. Aortic valve regurgitation is not visualized. Aortic valve  sclerosis is present, with no evidence of aortic valve stenosis.   5. The inferior vena cava is normal in size with <50% respiratory  variability, suggesting right atrial pressure of 8 mmHg.  _______________  Lower Extremity Venous Dopplers 08/23/2022: Summary:  Right:  - No evidence of deep vein thrombosis seen in the right lower extremity,  from the common femoral through the popliteal veins.  - Venous reflux is noted in the right common femoral vein.  - Venous reflux is noted in the right greater saphenous vein at medial  knee (out of fascia and entering mid calf area).  - Venous reflux is noted in the right short saphenous vein.   EKG:  EKG ordered today.   EKG Interpretation Date/Time:  Thursday October 20 2023 15:58:02 EDT Ventricular Rate:  67 PR Interval:  128 QRS  Duration:  80 QT Interval:  392 QTC Calculation: 414 R Axis:   39  Text Interpretation: Normal sinus rhythm Normal ECG Confirmed by Eric Nees [phone]) on 10/20/2023 5:21:09 PM    Recent Labs: No results found for requested labs within last 365 days.  Recent Lipid Panel No results found for: "CHOL", "TRIG", "HDL", "CHOLHDL", "VLDL", "LDLCALC", "LDLDIRECT"  Physical Exam:    Vital Signs: BP 112/74   Pulse 84   Ht 5' 7.5" (1.715 m)   Wt 139 lb (63 kg)   SpO2 97%   BMI 21.45 kg/m     Wt Readings from Last 3 Encounters:  10/20/23 139 lb (63 kg)  09/20/22 139 lb (63 kg)  08/23/22 138 lb (62.6 kg)     General: 68 y.o. thin Caucasian female in no acute distress. HEENT: Normocephalic and atraumatic. Sclera clear.  Neck: Supple. No carotid bruits. No JVD. Heart: RRR.Distinct S1 and S2. No murmurs, gallops, or rubs.  Lungs: No increased work of breathing. Clear to ausculation bilaterally. No wheezes, rhonchi, or rales.  Extremities: No lower extremity edema. Mild varicose veins noted on bilateral feet.  Skin: Warm and dry. Neuro: No focal deficits. Psych: Normal affect.  Responds appropriately.   Assessment:    1. Chest pain of uncertain etiology   2. Palpitations   3. Paroxysmal SVT (supraventricular tachycardia) (HCC)   4. Chronic venous insufficiency     Plan:    Chest Pain Patient reported intermittent chest pain at last visit in 09/2022. Coronary CTA was ordered but was not completed (she states she never got called to schedule the test).  - She continues to have intermittent chest pain at rest. No exertion chest pain. - Symptoms sound atypical. However, she does have a family history of CAD. Will order coronary CTA again for further evaluation. Will provide a one time dose of Lopressor 50mg  for her to take 2 hours prior to scan (instructed her to take this rather than her usual Toprol-XL). Will get BMET today.  Palpitations Paroxysmal SVT History of  palpitations. Monitor in 06/2022 showed 10 runs of SVT with longest run lasting 16.1 seconds with average heart rate of 113 bpm and fastest run lasting 13 beats with a max rate of 200 bpm. However, triggered events corresponded with sinus rhythm.  - She reports worsening palpitations that she describes as a fluttering sensation over the last 2 months.  - Electrolytes have previously been normal. However, it does not looks like she has had thyroid checked. Will check TSH, free T4, and free T3.  - Will increase Toprol-XL to 25mg  daily.  - Recommended Kardia Mobile device.  Chronic Venous Insufficiency Right venous doppler in 08/2022 was negative for DVT but showed venous reflux in the right common femoral veins, right greater saphenous vein at medial knee, and right short saphenous vein. She was not felt to have veins amenable to laser ablation. - Stable. No edema on exam.  - She declines compression stockings.  - Managed by Vascular Surgery.   Disposition: Follow up in 6 months.    Signed, Casimer Clear, PA-C  10/20/2023 5:36 PM    [facility]

## 2023-10-20 ENCOUNTER — Ambulatory Visit: Attending: Student | Admitting: Student

## 2023-10-20 ENCOUNTER — Encounter: Payer: Self-pay | Admitting: Student

## 2023-10-20 VITALS — BP 112/74 | HR 84 | Ht 67.5 in | Wt 139.0 lb

## 2023-10-20 DIAGNOSIS — R079 Chest pain, unspecified: Secondary | ICD-10-CM | POA: Diagnosis present

## 2023-10-20 DIAGNOSIS — I471 Supraventricular tachycardia, unspecified: Secondary | ICD-10-CM

## 2023-10-20 DIAGNOSIS — R002 Palpitations: Secondary | ICD-10-CM | POA: Diagnosis present

## 2023-10-20 DIAGNOSIS — I872 Venous insufficiency (chronic) (peripheral): Secondary | ICD-10-CM

## 2023-10-20 MED ORDER — METOPROLOL SUCCINATE ER 25 MG PO TB24: 25.0000 mg | ORAL_TABLET | Freq: Every day | ORAL | 6 refills | Status: DC

## 2023-10-20 MED ORDER — METOPROLOL TARTRATE 50 MG PO TABS: ORAL_TABLET | ORAL | 0 refills | Status: AC

## 2023-10-20 NOTE — Patient Instructions (Addendum)
 Medication Instructions:  INCREASE METOPROLOL SUCCINATE 25MG  DAILY *If you need a refill on your cardiac medications before your next appointment, please call your pharmacy*  Lab Work: BMET, TSH, FREE T3 AND FREE T4 TODAY If you have labs (blood work) drawn today and your tests are completely normal, you will receive your results only by:  MyChart Message (if you have MyChart) OR A paper copy in the mail If you have any lab test that is abnormal or we need to change your treatment, we will call you to review the results.  Testing/Procedures: Coronary CT Angiography (CTA), (SEE BELOW FOR DIRECTIONS)is a special type of CT scan that uses a computer to produce multi-dimensional views of major blood vessels throughout the body. In CT angiography, a contrast material is injected through an IV to help visualize the blood vessels.  Follow-Up: At [facility], you and your health needs are our priority.  As part of our continuing mission to provide you with exceptional heart care, our providers are all part of one team.  This team includes your primary Cardiologist (physician) and Advanced Practice Providers or APPs (Physician Assistants and Nurse Practitioners) who all work together to provide you with the care you need, when you need it.  Your next appointment:   6 month(s)  Provider:   Thomasene Ripple, DO    Other Instructions   Your cardiac CT will be scheduled at one of the below locations:   [facility] [address] [place], [place] [address] [phone]  OR  [facility] [address] [place], [place] [address] [phone]  OR   [facility] [address] [place], [place] [address] [phone]  OR   [facility] [address] [place], [place] [address] [phone]  OR   [facility] [address]  [place], [place]  [address] Opening October 31, 2023  If scheduled at [facility], please arrive at the [facility] and Children's Entrance (Entrance C2) of [facility] 30 minutes prior to test start time. You can use the FREE valet parking offered at entrance C (encouraged to control the heart rate for the test)  Proceed to the [facility] Radiology Department (first floor) to check-in and test prep.   All radiology patients and guests should use entrance C2 at [facility], accessed from [facility], even though the hospital's physical address listed is [address]    If scheduled at the Heart and Vascular Tower at [address], please enter the parking lot using the [address] entrance and use the FREE valet service at the patient drop-off area. Enter the buidling and check-in with registration on the main floor.  If scheduled at [facility] or [facility], please arrive 15 mins early for check-in and test prep.  There is spacious parking and easy access to the radiology department from the [facility] Heart and Vascular entrance. Please enter here and check-in with the desk attendant.   If scheduled at [facility], please arrive 30 minutes early for check-in and test prep.  Please follow these instructions carefully (unless otherwise directed):  An IV will be required for this test and Nitroglycerin will be given.  Hold all erectile dysfunction medications at least 3 days (72 hrs) prior to test. (Ie viagra, cialis, sildenafil, tadalafil, etc)   On the Night Before the Test: Be  sure to Drink plenty of water. Do not consume any caffeinated/decaffeinated beverages or chocolate 12 hours prior to your test. Do not take any antihistamines 12 hours prior to your test.  On the Day of the Test: Drink plenty of water until 1 hour prior to the test. Do not eat any food 1 hour prior to test. You may take your regular  medications prior to the test.  Take metoprolol (Lopressor) 50mg  two hours prior to test. If you take Furosemide/Hydrochlorothiazide/Spironolactone/Chlorthalidone, please HOLD on the morning of the test. Patients who wear a continuous glucose monitor MUST remove the device prior to scanning. FEMALES- please wear underwire-free bra if available, avoid dresses & tight clothing  After the Test: Drink plenty of water. After receiving IV contrast, you may experience a mild flushed feeling. This is normal. On occasion, you may experience a mild rash up to 24 hours after the test. This is not dangerous. If this occurs, you can take Benadryl 25 mg, Zyrtec, Claritin, or Allegra and increase your fluid intake. (Patients taking Tikosyn should avoid Benadryl, and may take Zyrtec, Claritin, or Allegra) If you experience trouble breathing, this can be serious. If it is severe call 911 IMMEDIATELY. If it is mild, please call our office.  We will call to schedule your test 2-4 weeks out understanding that some insurance companies will need an authorization prior to the service being performed.   For more information and frequently asked questions, please visit our website : [email or web address]  For non-scheduling related questions, please contact the cardiac imaging nurse navigator should you have any questions/concerns: Cardiac Imaging Nurse Navigators Direct Office Dial: [phone]   For scheduling needs, including cancellations and rescheduling, please call [place], [phone].       1st Floor: - Lobby - Registration  - Pharmacy  - Lab - Cafe  2nd Floor: - PV Lab - Diagnostic Testing (echo, CT, nuclear med)  3rd Floor: - Vacant  4th Floor: - TCTS (cardiothoracic surgery) - AFib Clinic - Structural Heart Clinic - Vascular Surgery  - Vascular Ultrasound  5th Floor: - HeartCare Cardiology (general and EP) - Clinical Pharmacy for coumadin, hypertension, lipid,  weight-loss medications, and med management appointments    Valet parking services will be available as well.

## 2023-10-21 LAB — BASIC METABOLIC PANEL WITH GFR
BUN/Creatinine Ratio: 25 (ref 12–28)
BUN: 19 mg/dL (ref 8–27)
CO2: 22 mmol/L (ref 20–29)
Calcium: 10.3 mg/dL (ref 8.7–10.3)
Chloride: 100 mmol/L (ref 96–106)
Creatinine, Ser: 0.75 mg/dL (ref 0.57–1.00)
Glucose: 93 mg/dL (ref 70–99)
Potassium: 4.3 mmol/L (ref 3.5–5.2)
Sodium: 141 mmol/L (ref 134–144)
eGFR: 87 mL/min/{1.73_m2} (ref >59)

## 2023-10-21 LAB — TSH: TSH: 2.06 u[IU]/mL (ref 0.450–4.500)

## 2023-10-21 LAB — T4, FREE: Free T4: 1.17 ng/dL (ref 0.82–1.77)

## 2023-10-21 LAB — T3, FREE: T3, Free: 2.6 pg/mL (ref 2.0–4.4)

## 2023-10-28 ENCOUNTER — Encounter (HOSPITAL_COMMUNITY): Payer: Self-pay

## 2023-10-31 ENCOUNTER — Telehealth (HOSPITAL_COMMUNITY): Payer: Self-pay | Admitting: *Deleted

## 2023-10-31 NOTE — Telephone Encounter (Signed)
 Attempted to call patient regarding upcoming cardiac CT appointment. Left message on voicemail with name and callback number Johney Frame RN Navigator Cardiac Imaging [facility] [phone] Office

## 2023-11-01 ENCOUNTER — Ambulatory Visit (HOSPITAL_COMMUNITY)
Admission: RE | Admit: 2023-11-01 | Discharge: 2023-11-01 | Disposition: A | Discharge status: Home / Self Care | Source: Ambulatory Visit | Attending: Internal Medicine | Admitting: Internal Medicine

## 2023-11-01 DIAGNOSIS — I2510 Atherosclerotic heart disease of native coronary artery without angina pectoris: Secondary | ICD-10-CM | POA: Diagnosis not present

## 2023-11-01 DIAGNOSIS — R079 Chest pain, unspecified: Secondary | ICD-10-CM | POA: Diagnosis present

## 2023-11-01 MED ORDER — IOHEXOL 350 MG/ML SOLN
100.0000 mL | Freq: Once | INTRAVENOUS | Status: AC | PRN
  Administered 2023-11-01: 100 mL via INTRAVENOUS

## 2023-11-01 MED ORDER — NITROGLYCERIN 0.4 MG SL SUBL
SUBLINGUAL_TABLET | SUBLINGUAL | Status: AC
  Filled 2023-11-01: qty 2

## 2023-11-01 MED ORDER — NITROGLYCERIN 0.4 MG SL SUBL: 0.8000 mg | SUBLINGUAL_TABLET | Freq: Once | SUBLINGUAL | Status: DC

## 2023-11-02 ENCOUNTER — Ambulatory Visit (HOSPITAL_COMMUNITY)
Admission: RE | Admit: 2023-11-02 | Discharge: 2023-11-02 | Disposition: A | Discharge status: Home / Self Care | Source: Ambulatory Visit | Attending: Cardiology | Admitting: Cardiology

## 2023-11-02 ENCOUNTER — Other Ambulatory Visit: Payer: Self-pay | Admitting: Cardiology

## 2023-11-02 DIAGNOSIS — R931 Abnormal findings on diagnostic imaging of heart and coronary circulation: Secondary | ICD-10-CM | POA: Diagnosis present

## 2023-11-10 ENCOUNTER — Telehealth: Payer: Self-pay

## 2023-11-10 DIAGNOSIS — R0789 Other chest pain: Secondary | ICD-10-CM

## 2023-11-10 DIAGNOSIS — R931 Abnormal findings on diagnostic imaging of heart and coronary circulation: Secondary | ICD-10-CM

## 2023-11-10 MED ORDER — ATORVASTATIN CALCIUM 40 MG PO TABS: 40.0000 mg | ORAL_TABLET | Freq: Every day | ORAL | 6 refills | Status: DC

## 2023-11-10 NOTE — Telephone Encounter (Signed)
 Spoke to patient advised to have fasting lipid and hepatic panels done in 6 to 8 weeks.Patient unable to tell me what kind of clotting disorder she has. Records requested from PCP Kenney Peacemaker NP.

## 2023-11-10 NOTE — Telephone Encounter (Signed)
 Spoke to patient recent Coronary Ct results given.Patient stated she has a problem with her blood clotting.She takes Vitamin K daily.She is concerned about starting Aspirin 81 mg daily.She will start Atorvastatin 40 mg daily.  Advised I will send message to Callie Goodrich PA for advice on taking Aspirin and I will ask her to clarify to repeat lipid and hepatic panels in 2 weeks or 8 weeks.

## 2023-11-10 NOTE — Telephone Encounter (Signed)
 Does she know what type of blood clotting disorder she has? I see that she has a history of a blood transfusion but cannot see any other details. If she doesn't know what blood clotting disorder she has, can we try to get records from PCP?   And sorry that was an error on my part. Repeat lipid and LFTs should be in 6-8 weeks.  Thanks so much! Davionne Mastrangelo

## 2023-11-18 ENCOUNTER — Ambulatory Visit: Payer: Self-pay | Admitting: Student

## 2023-11-22 NOTE — Telephone Encounter (Signed)
 Returned call to pt she states that she is taking Vitamin K for frequent nose bleeds. She does not have any clotting disorder that she knows/dx with. She was wondering if she should be taking the aspirin 81mg  since she is taking the vitamin K? From what she understands this will make the vitamin K ineffective. Please advise

## 2023-11-23 NOTE — Telephone Encounter (Signed)
 It should be fine for her to take Aspirin 81mg  daily. Thank you!

## 2023-11-24 MED ORDER — ASPIRIN 81 MG PO TBEC: 81.0000 mg | DELAYED_RELEASE_TABLET | Freq: Every day | ORAL | Status: AC

## 2023-11-24 NOTE — Addendum Note (Signed)
 Addended by: Dorthey Gave on: 11/24/2023 01:28 PM   Modules accepted: Orders

## 2023-11-24 NOTE — Telephone Encounter (Signed)
 Called pt she will start the aspirin 81mg  daily.  Not the chewable 81mg , verbalized understanding.  Added to medication list.

## 2024-02-01 ENCOUNTER — Ambulatory Visit: Payer: Self-pay | Admitting: Student

## 2024-02-01 DIAGNOSIS — I2510 Atherosclerotic heart disease of native coronary artery without angina pectoris: Secondary | ICD-10-CM

## 2024-02-01 DIAGNOSIS — Z79899 Other long term (current) drug therapy: Secondary | ICD-10-CM

## 2024-02-01 DIAGNOSIS — E785 Hyperlipidemia, unspecified: Secondary | ICD-10-CM

## 2024-02-01 LAB — LIPID PANEL
Chol/HDL Ratio: 3.2 ratio (ref 0.0–4.4)
Cholesterol, Total: 189 mg/dL (ref 100–199)
HDL: 60 mg/dL (ref >39)
LDL Chol Calc (NIH): 116 mg/dL — ABNORMAL HIGH (ref 0–99)
Triglycerides: 72 mg/dL (ref 0–149)
VLDL Cholesterol Cal: 13 mg/dL (ref 5–40)

## 2024-02-01 LAB — HEPATIC FUNCTION PANEL
ALT: 21 IU/L (ref 0–32)
AST: 25 IU/L (ref 0–40)
Albumin: 4.6 g/dL (ref 3.9–4.9)
Alkaline Phosphatase: 59 IU/L (ref 44–121)
Bilirubin Total: 0.4 mg/dL (ref 0.0–1.2)
Bilirubin, Direct: 0.16 mg/dL (ref 0.00–0.40)
Total Protein: 6.8 g/dL (ref 6.0–8.5)

## 2024-05-10 LAB — LIPID PANEL
Chol/HDL Ratio: 3.1 ratio (ref 0.0–4.4)
Cholesterol, Total: 171 mg/dL (ref 100–199)
HDL: 55 mg/dL (ref >39)
LDL Chol Calc (NIH): 101 mg/dL — ABNORMAL HIGH (ref 0–99)
Triglycerides: 78 mg/dL (ref 0–149)
VLDL Cholesterol Cal: 15 mg/dL (ref 5–40)

## 2024-05-15 ENCOUNTER — Other Ambulatory Visit: Payer: Self-pay | Admitting: Student

## 2024-05-23 MED ORDER — ROSUVASTATIN CALCIUM 20 MG PO TABS: 20.0000 mg | ORAL_TABLET | Freq: Every day | ORAL | 3 refills | Status: AC

## 2024-05-23 NOTE — Addendum Note (Signed)
 Addended by: VICCI ROXIE CROME on: 05/23/2024 03:59 PM   Modules accepted: Orders

## 2024-07-11 ENCOUNTER — Telehealth: Payer: Self-pay | Admitting: Cardiology

## 2024-07-11 NOTE — Telephone Encounter (Signed)
 Patient says she hasn't started cholesterol medications. She would like to have a repeat CT if possible. She requests a call back to discuss. She says she would prefer a call instead of messaging back and forth on MyChart.

## 2024-07-11 NOTE — Telephone Encounter (Signed)
 Informed patient of Dr Sheena instructions. Verbalizes understanding.

## 2024-07-11 NOTE — Telephone Encounter (Signed)
 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 Precious CT coronary scan performed last April 2025. Pt wanting to get test redone prior to starting cholesterol medications. Pt has not started Rosuvastatin  ordered 05/23/2024. Will send to Dr. Sheena and Aline Door, PA-C for further recommendations. Pt verbalizes understanding of plan.
